# Patient Record
Sex: Female | Born: 1950
Health system: Southern US, Community
[De-identification: ages and names within clinical notes are randomized; demographics above are authoritative.]

## PROBLEM LIST (undated history)

## (undated) DIAGNOSIS — R51 Headache: Secondary | ICD-10-CM

## (undated) DIAGNOSIS — R112 Nausea with vomiting, unspecified: Secondary | ICD-10-CM

## (undated) DIAGNOSIS — T4145XA Adverse effect of unspecified anesthetic, initial encounter: Secondary | ICD-10-CM

## (undated) DIAGNOSIS — Z9889 Other specified postprocedural states: Secondary | ICD-10-CM

## (undated) DIAGNOSIS — M1990 Unspecified osteoarthritis, unspecified site: Secondary | ICD-10-CM

## (undated) DIAGNOSIS — R519 Headache, unspecified: Secondary | ICD-10-CM

## (undated) DIAGNOSIS — M419 Scoliosis, unspecified: Secondary | ICD-10-CM

## (undated) DIAGNOSIS — N189 Chronic kidney disease, unspecified: Secondary | ICD-10-CM

## (undated) DIAGNOSIS — T8859XA Other complications of anesthesia, initial encounter: Secondary | ICD-10-CM

## (undated) DIAGNOSIS — C801 Malignant (primary) neoplasm, unspecified: Secondary | ICD-10-CM

## (undated) HISTORY — PX: TUBAL LIGATION: SHX77

---
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
## 1978-12-05 HISTORY — PX: OTHER SURGICAL HISTORY: SHX169

## 1986-12-05 HISTORY — PX: TUBAL LIGATION: SHX77

## 1987-12-06 HISTORY — PX: OTHER SURGICAL HISTORY: SHX169

## 1997-12-05 HISTORY — PX: BREAST SURGERY: SHX581

## 2001-10-31 ENCOUNTER — Ambulatory Visit (HOSPITAL_COMMUNITY): Admission: RE | Admit: 2001-10-31 | Discharge: 2001-10-31 | Payer: Self-pay | Admitting: Orthopaedic Surgery

## 2001-10-31 ENCOUNTER — Encounter: Payer: Self-pay | Admitting: Orthopaedic Surgery

## 2001-12-05 HISTORY — PX: JOINT REPLACEMENT: SHX530

## 2002-04-24 ENCOUNTER — Ambulatory Visit (HOSPITAL_COMMUNITY): Admission: RE | Admit: 2002-04-24 | Discharge: 2002-04-24 | Payer: Self-pay | Admitting: Orthopedic Surgery

## 2002-04-24 ENCOUNTER — Encounter: Payer: Self-pay | Admitting: Orthopedic Surgery

## 2002-05-22 ENCOUNTER — Encounter: Payer: Self-pay | Admitting: Orthopaedic Surgery

## 2002-05-22 ENCOUNTER — Inpatient Hospital Stay (HOSPITAL_COMMUNITY): Admission: RE | Admit: 2002-05-22 | Discharge: 2002-05-26 | Payer: Self-pay | Admitting: Orthopaedic Surgery

## 2003-05-14 ENCOUNTER — Ambulatory Visit (HOSPITAL_COMMUNITY): Admission: RE | Admit: 2003-05-14 | Discharge: 2003-05-14 | Payer: Self-pay | Admitting: Orthopaedic Surgery

## 2003-05-14 ENCOUNTER — Encounter: Payer: Self-pay | Admitting: Orthopaedic Surgery

## 2004-11-24 ENCOUNTER — Ambulatory Visit (HOSPITAL_COMMUNITY): Admission: RE | Admit: 2004-11-24 | Discharge: 2004-11-24 | Payer: Self-pay | Admitting: Orthopaedic Surgery

## 2006-01-27 ENCOUNTER — Encounter: Admission: RE | Admit: 2006-01-27 | Discharge: 2006-01-27 | Payer: Self-pay | Admitting: Orthopaedic Surgery

## 2006-02-10 ENCOUNTER — Encounter: Admission: RE | Admit: 2006-02-10 | Discharge: 2006-02-10 | Payer: Self-pay | Admitting: Orthopaedic Surgery

## 2006-03-31 ENCOUNTER — Encounter: Admission: RE | Admit: 2006-03-31 | Discharge: 2006-03-31 | Payer: Self-pay | Admitting: Orthopaedic Surgery

## 2006-07-05 ENCOUNTER — Encounter: Admission: RE | Admit: 2006-07-05 | Discharge: 2006-07-05 | Payer: Self-pay | Admitting: Neurosurgery

## 2008-12-05 HISTORY — PX: BACK SURGERY: SHX140

## 2009-09-21 ENCOUNTER — Ambulatory Visit (HOSPITAL_COMMUNITY): Admission: RE | Admit: 2009-09-21 | Discharge: 2009-09-21 | Payer: Self-pay | Admitting: Neurosurgery

## 2011-03-10 LAB — CBC
HCT: 39.1 % (ref 36.0–46.0)
Hemoglobin: 13.6 g/dL (ref 12.0–15.0)

## 2011-04-22 NOTE — Op Note (Signed)
[place]. [facility]  Patient:    MEGHIN, THIVIERGE Visit Number: [id number] MRN: [id number]          Service Type: SUR Location: 5000 5022 01 Attending Physician:  Randolm Idol Dictated by:   Claude Manges. Cleophas Dunker, M.D. Proc. Date: 05/22/02 Admit Date:  05/22/2002                             Operative Report  PREOPERATIVE DIAGNOSIS:  Osteoarthritis, right hip with right hip deformity secondary to [employer].  POSTOPERATIVE DIAGNOSIS:  Osteoarthritis, right hip with right hip deformity secondary to [facility].  OPERATION PERFORMED:  Right total hip replacement.  SURGEON:  Claude Manges. Cleophas Dunker, M.D.  ASSISTANT:  Georgena Spurling, M.D.  COMPLICATIONS:  None.  COMPONENTS:  Depuy AML small stature 10.5 femoral component.  A 32 mm outer diameter femoral head with a +1 mm neck length, 54 mm outer diameter pinnacle acetabular cup with three spikes.  Apex hole eliminator and the Pinnacle Marathon lipped acetabular liner with a 10 degree angle.  DESCRIPTION OF PROCEDURE:  With the patient comfortable on the operating table and under general orotracheal anesthesia, the nursing staff inserted a Foley catheter.  The patient was then placed in a lateral decubitus position with the right side up and maintained with the Innomed hip system.  The right hip was then prepped with Betadine scrub and DuraPrep from the iliac crest to below the knee.  Sterile draping was performed.  A routine Southern incision and via sharp dissection carried down to subcutaneous tissue.  The adipose was incised with a Bovie.  Self-retaining retractors were inserted.  The iliotibial band was identified and incised along the length of the skin incision.  Again self-retaining retractors were inserted more deeply. With the hip internally rotated, the short external rotators were identified and they were tagged with 0 Tycron suture and the tendinous structures were then incised with a Bovie.  The  capsule was identified and incised on the femoral neck and head.  There was a small clear yellow joint effusion.  The head was then dislocated posteriorly without difficulty.  The head was oval and significantly misshapen.  There were large areas of articular cartilage loss.  The calcar guide was used to osteotomize the calcar with the oscillating saw.  The head was then removed.  The soft tissue was then incised about the piriformis fossa.  A starter hole was then made and reaming was performed to 10 mm at which point, I had excellent endosteal purchase.  Calcar reamer confirmed the fact that I was well within the canal.  The smallest rasp was a 10-5 medial modified aspect. This was carefully impacted under the calcar.  Calcar reamer was used to obtain appropriate calcar angle.  The level of the resection was above the lesser trochanter as there was very little if any neck remaining because of the significant deformity.  The acetabular retractors were inserted.  The acetabular labrum was sharply incised, retractors placed carefully around the periphery of the acetabulum.  Reaming was then performed to 53 mm to accept a 54 mm prosthesis.  There was considerable shallowness of the acetabulum which was altered with the reamers so that we had a nice oval rim.  We elected to use the trispike acetabular component for better purchase and then this was impacted using a 54 mm outer diameter.  There was a little bit of a gap superiorly but it  fit very nicely medially, laterally and inferiorly.  We inserted the trial acetabular polyethylene liner and then reapplied the rasp and the +1 neck length with a 32 mm hip ball.  We had excellent stability in flexion and extension.  We had re-established some leg length discrepancy.  She was at least 5/8 inch short preoperatively.  The trial components were removed.  We checked to make sure the acetabulum was impacted flush into the acetabulum.  The apex  hole eliminator was inserted followed by the final Marathon polyethylene liner.  The 10.5 mm femoral component was then impacted onto the calcar and then we again trialed the +1 neck length 32 mm hip ball.  We had excellent stability.  Accordingly, the neck was then cleaned of any fluid and final hip ball was then applied i.e. a 32 mm outer diameter with a +1 mm neck length.  The hip was then reduced and the hip was placed through a full range of motion with excellent stability in both flexion and extension.  The wound was irrigated with saline and antibiotic solution throughout the operative procedure.  We had a nice dry bed and a Hemovac was not necessary. The hip capsule was closed with interrupted #1 Ethibond.  Short external rotators were reapproximated with the same material.  The iliotibial band was closed with running 0 Vicryl.  The subcutaneous closed in several layers with 0 and 2-0 Vicryl.  The skin was closed with skin clips.  The procedure was technically difficult because of the significant deformity. I felt  we had an excellent result.  The patient tolerated the procedure without complications. Dictated by:   Claude Manges. Cleophas Dunker, M.D. Attending Physician:  Randolm Idol DD:  05/22/02 TD:  05/23/02 Job: [id number]

## 2011-04-22 NOTE — H&P (Signed)
[place]. [facility]  Patient:    Leslie, Grant Visit Number: [id number] MRN: [id number]          Service Type: SUR Location: 5000 5022 01 Attending Physician:  Randolm Idol Dictated by:   Arnoldo Morale, P.A. Admit Date:  05/22/2002 Discharge Date: 05/26/2002                           History and Physical  DATE OF BIRTH:  1951-04-04  CHIEF COMPLAINT:  Progressively worsening right hip pain for many years.  HISTORY OF PRESENT ILLNESS:  This 60 year old white female patient presents to Dr. 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 She has had no known injury or any surgery to the hip since that time, but she has had pain in her right hip since she was a teenager.  It has been getting progressively worse over the last several years.  At this time the pain in the right hip is described as an intermittent dull sensation located mainly over the greater trochanter, buttock, and back region with occasional radiation distally into her knee.  It increases when she sits in a certain position or if she bends over or has to walk in her bare feet.  She does have a leg length discrepancy of about 5/8 of an inch.  The pain does decrease when she lies down or flexes at the hip.  She does feel that the hip pops at times and she has had problems with back pain because of her leg length discrepancy and was recently diagnosed with an annular tear on the left side at L4-5.  That was treated with Medrol Dosepak and she is doing better with that.  She is not currently taking any medications really for pain in her hip other than occasional Advil.  She does not walk with any assistive devices.  ALLERGIES:  No known drug allergies.  CURRENT MEDICATIONS: 1. Tamoxifen 20 mg p.o. q.d. 2. Tylenol PM two tablets p.o. q.h.s. p.r.n. insomnia. 3. Tums four tablets p.o. q.h.s.  PAST MEDICAL HISTORY: 1. She was diagnosed with breast  cancer in 1999 and had a left breast    lumpectomy with radiation treatment and she has almost completed her    Tamoxifen. 2. She denies any other medical problems including diabetes, hypertension,    thyroid disease, hiatal hernia, peptic ulcer disease, heart disease,    asthma, reflux, or any other chronic medical condition.  PAST SURGICAL HISTORY: 1. Tonsillectomy in 1959. 2. Excision of a sweat gland tumor in 1980 by Dr. Ivonne Andrew. 3. Bilateral tubal ligation in 1988 by Dr. Herschell Dimes. 4. Removal of infarcted fallopian tube in 1989 by Dr. Herschell Dimes. 5. Lumpectomy, left breast, in 1999 by Dr. Lyda Kalata.  SOCIAL HISTORY:  She has a five pack-year history of cigarette smoking, which she quit 20 years ago.  She drinks alcohol socially about once a month.  She does not use any drugs.  She is married and has two children.  She and her husband live in a two story house with two steps into the main entrance.  Her bedroom in that house is on the second floor, but they may be moving to a one story house with no steps, and they are going to try to do that before her surgery.  She is currently an associate professor of biology at [employer]  Her medical doctor is  Dr. Herschell Dimes, which is her OB-GYN.  His phone number is [phone] and her oncologist is Dr. Fredricka Bonine at [phone].  FAMILY HISTORY:  Her mother is alive at age 54 with hypertension, osteoporosis, and early glaucoma.  Her father is alive at age 37 with a history of colon cancer.  She has two brothers, age 25 and 84, and two sisters, age 8 and 20, and they are all alive and well.  Her two daughters are age 50 and 75 and they are alive and healthy.  REVIEW OF SYSTEMS:  She does have a history of headaches in the past, but they seem to have lessened since she changed jobs.  Also since she has gone through menopause, she seems to get them less and less.  She does complain of occasional neck pain.  She does have scoliosis  due to her hip.  She does have a history of erythema nodosum but has had no problems with that recently.  She does wear glasses.  She does not have a living will and her power of attorney is Geanie Cooley, her husband.  All other systems are negative and noncontributory.  PHYSICAL EXAMINATION:  GENERAL:  A well-developed, well-nourished, thin white female, who walks with a limp on the right.  Mood and affect are appropriate.  She talks easily with the examiner.  VITAL SIGNS:  Height 5 feet 7-1/2 inches, weight 145 pounds, BMI is 22. Temperature 98.6 degrees Fahrenheit, pulse 84, respirations 14, and blood pressure 136/72.  HEENT:  Normocephalic, atraumatic, without frontal or maxillary sinus tenderness to palpation.  Conjunctivae pink, sclerae anicteric.  PERRLA.  EOMs intact.  Funduscopic exam shows good red reflex bilaterally.  No visible external ear deformities.  Hearing grossly intact.  Tympanic membranes pearly gray bilaterally with good light reflex.  Nasal septum midline.  Nasal mucosa pink and moist without exudates or polyps noted.  Buccal mucosa pink and moist.  Good dentition.  Pharynx without erythema or exudates.  Tongue and uvula midline.  Tongue without fasciculations and uvula rises equally with phonation.  NECK:  No visible masses or lesions noted.  Trachea midline.  No palpable lymphadenopathy or thyromegaly.  She has full range of motion and nontender to palpation along the cervical spine.  CARDIOVASCULAR:  Heart rate and rhythm regular.  S1, S2 present without rubs, clicks, or murmurs noted.  RESPIRATORY:  Respirations even and unlabored.  Sounds clear to auscultation bilaterally without rales or wheezes noted.  ABDOMEN:  Rounded abdominal contour.  Bowel sounds present x4 quadrants. Soft, nontender to palpation without hepatosplenomegaly or CVA tenderness. Femoral pulses +2 bilaterally.  She is currently nontender to palpation along the entire length of the  vertebral column.  BREASTS/GENITOURINARY/RECTAL/PELVIC:  These exams deferred at this time.   MUSCULOSKELETAL:  She has no obvious deformities.  Bilateral upper extremities with full range of motion of these extremities without pain.  Radial pulses +2 bilaterally.  She has full range of motion of her knees, ankles, and toes bilaterally.  DP and PT pulses are +2.  She has no pain with palpation about the left hip at all and has full range of motion internal and external rotation without pain.  She has no real pain with palpation over the right groin or right greater trochanter.  She is able to actively get her hip up to 90 degrees and then she can possibly move it up to about 100-110 degrees.  She has full extension, but little internal and external rotation  and these movements do cause pain.  Abduction is probably only about 15 degrees on the right.  NEUROLOGIC:  Alert and oriented x3.  Cranial nerves II-XII are grossly intact. Strength 5/5 bilateral upper and lower extremities.  Rapid alternating movements intact.  Deep tendon reflexes 2+ bilateral upper and lower extremities.  Sensation intact to light touch.  RADIOLOGIC FINDINGS:  X-rays taken in our office have shown a dysplastic hip on the right.  IMPRESSION: 1. 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 2. History of breast cancer, status post lumpectomy, lymph node dissection,    and radiation. 3. Annular tear, L4-5.  PLAN:  [name] will be admitted to [facility] on May 22, 2002, where she will undergo a right total hip arthroplasty by Dr. Claude Manges. Whitfield.  She will undergo all the routine preoperative laboratory tests and studies prior to this procedure.  She can have no blood pressures or sticks on her left arm because of her lymph node dissection.  She has donated three units of autologous blood for this procedure and if we have any medical problems while she is hospitalized, we  will consult one of the hospitalists. Dictated by:   Arnoldo Morale, P.A. Attending Physician:  Randolm Idol DD:  05/10/02 TD:  05/11/02 Job: [id number]

## 2011-04-22 NOTE — Discharge Summary (Signed)
NAME:  [name], [name]                        ACCOUNT NO.:  [id number]   MEDICAL RECORD NO.:  [id number]                   PATIENT TYPE:  NP   LOCATION:  5022                                 FACILITY:  [facility]   PHYSICIAN:  Claude Manges. Cleophas Dunker, M.D.            DATE OF BIRTH:  17-Oct-1951   DATE OF ADMISSION:  05/22/2002  DATE OF DISCHARGE:  05/26/2002                                 DISCHARGE SUMMARY   ADMISSION DIAGNOSES:  1. Severe dysplasia, right hip, secondary to congenitally dislocated hip.  2. Left breast cancer status post lumpectomy, lymph node dissection and     radiation.  3. Annular tear of disk, L4-L5.   DISCHARGE DIAGNOSES:  1. Severely dysplastic right hip secondary to congenital problem, status     post right total hip arthroplasty.  2. Left breast cancer status post lumpectomy, lymph node dissection and     radiation.  3. Annular tear of the disk at L4-L5.  4. Acute blood loss anemia secondary to surgery.  5. Hematoma, right hip.  6. Hypokalemia.  7. Constipation.  8. Hallucination secondary to narcotic medications.   SURGICAL PROCEDURE:  On May 22, 2002, the patient underwent a right total  hip arthroplasty by Dr. Claude Manges. Whitfield assisted by Dr. Dannielle Huh.   COMPLICATIONS:  None.   CONSULTATIONS:  1. Physical therapy consultation on May 23, 2002.  2. Pharmacy consult for Coumadin therapy on May 22, 2002.  3. Rehabilitation consultation, May 23, 2002.  4. Occupational therapy consultation, May 25, 2002.   HISTORY OF PRESENT ILLNESS:  This 60 year old white female patient presented  to Dr. Cleophas Dunker with a history of a congenitally dislocated hip at birth.  She has had no other injury to her right hip.  She has had progressively  worsening right hip pain over the last several years.  At this point the  pain is described as an intermittent, dull sensation over the greater  trochanter, buttock and back with occasional radiation distally into her  knee.  It increases with sitting in certain positions or if she flexes at  the waist.  She has a leg length discrepancy on that side of about five-  eighths of an inch.  Pain decreases when she lies down.  She has been having  difficulty with back pain because of the leg length discrepancy, and because  of the severe dysplasia she is presenting for a right total hip  arthroplasty.   HOSPITAL COURSE:  The patient tolerated her surgical procedure well without  any postoperative complications.  She was transferred to 5000.  On  postoperative day #1 her temperature maximum was 101.6 and vital signs were  stable.  She had some mild nausea which was treated with medications.  Her  hemoglobin was low at 7.8 with a hematocrit of 22.5 and she was subsequently  transfused with two units of autologous blood.  Her potassium was low at 3.3  and that was supplemented.  She did tolerate transfusion well.   On postoperative day #2 her temperature maximum was 103.  Right hip incision  was well approximated with staples.  Hemoglobin had improved to 8.6 with a  hematocrit of 24.7.  Potassium was now 3.8.  Aggressive pulmonary toilet was  started.  She was switched to p.o. pain medications, continued on therapy  and started on Trinsicon.   She continued to make good progress over the next several days.  She did  have some difficulty with narcotic pain medicine and that was adjusted.  She  had problems with weird dreams and hallucinations on Demerol so she was  finally treated with Vicodin.  She had some problems with constipation and  that was treated effectively with a laxative.  On June 22 she was ready for  discharge home.   DISCHARGE DIET:  She can resume her regular, pre-hospitalization diet.   DISCHARGE MEDICATIONS:  She can resume her pre-hospitalization medications  including  1. Tamoxifen 20 mg p.o. q.d.  2. Tylenol PM two tablets p.o. q.h.s. p.r.n. insomnia.  3. Tums four tablets p.o.  q.h.s.   Additional medications include  1. Vioxx 25 mg p.o. q.d.  2. Vicodin 7.5/500 mg 1-2 p.o. q.4h. p.r.n. for pain.  3. Phenergan 25 mg one-half to one tablet p.o. q.4h. p.r.n. for nausea.  4. Coumadin which she is to take as directed by [facility]     pharmacy and the pharmacist here.  5. Iron 325 mg one tablet p.o. b.i.d. for one month.  6. Laxative as needed for constipation.  7. Skelaxin 400 mg 1-2 tablets p.o. q.6h. p.r.n. for muscle spasms, #45, no     refills.   DISCHARGE ACTIVITIES:  She is to be out of bed, partial weightbearing 50% or  less on the right leg with the use of the walker.  She has arranged for home  health physical therapy with per [facility], and R.N. to draw  her pro times in the pharmacy to monitor her Coumadin.   WOUND CARE:  She needs to keep her right hip incision clean and dry, may  shower after no drainage from the wound for two days.  She is to change the  dressing once a day or as needed depending on the drainage.  She is to  notify Dr. Cleophas Dunker of temperature greater than or equal to 101.5, chills,  pain unrelieved by pain medications or foul-smelling drainage from the  wound.   FOLLOWUP APPOINTMENTS:  She needs to follow up with Dr. Cleophas Dunker in  approximately 7 to 10 days and needs to call [phone] for that appointment.   LABORATORY DATA:  X-ray taken of her pelvis and hip on May 22, 2002, showed  the right total hip replacement in good position and alignment and no acute  bony abnormality.   On May 16, 2002, white blood cell count 3.7, hemoglobin 11, hematocrit  32.3, and platelet count 171,000.  On June 19 hemoglobin was 7.8, hematocrit  22.5.  On June 20 white blood cell count was 4.7, hemoglobin 8.6, hematocrit  24.7 and platelet count 98,000.  On June 21 hemoglobin 8.5, hematocrit 24.6,  and platelet count 113,000.   On June 12 PT was 12.9, INR 0.9, and PTT 28.  On June 22 PT was 21.1, INR  2.1.   On  June 19 sodium was 138, potassium 3.3, glucose 117 and calcium 8.  On  June 20 sodium  was 140, potassium 3.8, chloride 110, CO2 27, glucose 110,  BUN 5, creatinine 0.7 and calcium 8.5.  All other laboratory studies were  within normal limits.     Legrand Pitts Duffy, P.A.                      Claude Manges. Cleophas Dunker, M.D.    KED/MEDQ  D:  07/12/2002  T:  07/17/2002  Job:  [id number]   cc:   Fredricka Bonine

## 2013-11-01 ENCOUNTER — Other Ambulatory Visit: Payer: Self-pay | Admitting: Orthopaedic Surgery

## 2013-11-01 DIAGNOSIS — M7130 Other bursal cyst, unspecified site: Secondary | ICD-10-CM

## 2013-11-05 ENCOUNTER — Ambulatory Visit
Admission: RE | Admit: 2013-11-05 | Discharge: 2013-11-05 | Disposition: A | Discharge status: Home / Self Care | Payer: BC Managed Care – PPO | Source: Ambulatory Visit | Attending: Orthopaedic Surgery | Admitting: Orthopaedic Surgery

## 2013-11-05 DIAGNOSIS — M7130 Other bursal cyst, unspecified site: Secondary | ICD-10-CM

## 2013-11-05 MED ORDER — IOHEXOL 180 MG/ML  SOLN
1.0000 mL | Freq: Once | INTRAMUSCULAR | Status: AC | PRN
  Administered 2013-11-05: 1 mL via INTRA_ARTICULAR

## 2013-11-05 MED ORDER — DEXAMETHASONE SODIUM PHOSPHATE 4 MG/ML IJ SOLN
2.0000 mg | Freq: Once | INTRAMUSCULAR | Status: AC
  Administered 2013-11-05: 2 mg via INTRA_ARTICULAR

## 2013-11-08 NOTE — Progress Notes (Signed)
Patient called to report significant pain in lower back, down legs into both knees after synovial cyst drained 11/05/13.  She has resumed Meloxicam.  States pain is interfering with sleep.  She knows Dr. Benard Rink did inject small amount of steroid into area; I reminded her it will take another couple of days for that to (hopefully) kick in.  She knows to call us next week if pain continues

## 2013-11-18 ENCOUNTER — Encounter (HOSPITAL_COMMUNITY): Payer: Self-pay | Admitting: Pharmacy Technician

## 2013-11-18 ENCOUNTER — Other Ambulatory Visit: Payer: Self-pay | Admitting: Neurosurgery

## 2013-11-19 NOTE — Pre-Procedure Instructions (Signed)
RIE MCNEIL  11/19/2013   Your procedure is scheduled on:  Thursday, December 18th.  Report to [facility], Main Entrance or Entrance "A" at [facility].  Call this number if you have problems the morning of surgery: [phone]   Remember:   Do not eat food or drink liquids after midnight.   Take these medicines the morning of surgery with A SIP OF WATER: Gabapentin.  Take pain medication if needed.  Stop taking Aspirin, Coumadin, Plavix, Effient and Herbal medications.  Do not take any NSAIDs ie: Ibuprofen,  Advil,Naproxen or any medication containing Aspirin.  Do not wear jewelry, make-up or nail polish.  Do not wear lotions, powders, or perfumes.   Do not shave 48 hours prior to surgery.   Do not bring valuables to the hospital.  [facility] is not responsible  for any belongings or valuables.               Contacts, dentures or bridgework may not be worn into surgery.  Leave suitcase in the car. After surgery it may be brought to your room.  For patients admitted to the hospital, discharge time is determined by your treatment team.               Patients discharged the day of surgery will not be allowed to drive home.  Name and phone number of your driver: -   Special Instructions: [facility] [address], Main Entrance [facility] "A"   Please read over the following fact sheets that you were given: Pain Booklet, Coughing and Deep Breathing and Surgical Site Infection Prevention

## 2013-11-20 ENCOUNTER — Encounter (HOSPITAL_COMMUNITY)
Admission: RE | Admit: 2013-11-20 | Discharge: 2013-11-20 | Disposition: A | Discharge status: Home / Self Care | Payer: BC Managed Care – PPO | Source: Ambulatory Visit | Attending: Neurosurgery | Admitting: Neurosurgery

## 2013-11-20 ENCOUNTER — Encounter (HOSPITAL_COMMUNITY): Payer: Self-pay

## 2013-11-20 HISTORY — DX: Nausea with vomiting, unspecified: R11.2

## 2013-11-20 HISTORY — DX: Other specified postprocedural states: Z98.890

## 2013-11-20 HISTORY — DX: Malignant (primary) neoplasm, unspecified: C80.1

## 2013-11-20 HISTORY — DX: Scoliosis, unspecified: M41.9

## 2013-11-20 HISTORY — DX: Other complications of anesthesia, initial encounter: T88.59XA

## 2013-11-20 HISTORY — DX: Headache: R51

## 2013-11-20 HISTORY — DX: Headache, unspecified: R51.9

## 2013-11-20 HISTORY — DX: Adverse effect of unspecified anesthetic, initial encounter: T41.45XA

## 2013-11-20 HISTORY — DX: Chronic kidney disease, unspecified: N18.9

## 2013-11-20 HISTORY — DX: Unspecified osteoarthritis, unspecified site: M19.90

## 2013-11-20 LAB — CBC
Hemoglobin: 13.3 g/dL (ref 12.0–15.0)
MCV: 89 fL (ref 78.0–100.0)
Platelets: 183 10*3/uL (ref 150–400)
RBC: 4.26 MIL/uL (ref 3.87–5.11)
RDW: 12.5 % (ref 11.5–15.5)
WBC: 4.3 10*3/uL (ref 4.0–10.5)

## 2013-11-20 LAB — BASIC METABOLIC PANEL
BUN: 14 mg/dL (ref 6–23)
Calcium: 9.2 mg/dL (ref 8.4–10.5)
Chloride: 105 mEq/L (ref 96–112)
Creatinine, Ser: 0.52 mg/dL (ref 0.50–1.10)
GFR calc Af Amer: >90 mL/min (ref >90)

## 2013-11-20 MED ORDER — CEFAZOLIN SODIUM-DEXTROSE 2-3 GM-% IV SOLR
2.0000 g | INTRAVENOUS | Status: AC
  Administered 2013-11-21: 2 g via INTRAVENOUS
  Filled 2013-11-20: qty 50

## 2013-11-21 ENCOUNTER — Ambulatory Visit (HOSPITAL_COMMUNITY): Payer: BC Managed Care – PPO | Admitting: Anesthesiology

## 2013-11-21 ENCOUNTER — Encounter (HOSPITAL_COMMUNITY): Payer: Self-pay | Admitting: *Deleted

## 2013-11-21 ENCOUNTER — Ambulatory Visit (HOSPITAL_COMMUNITY)
Admission: RE | Admit: 2013-11-21 | Discharge: 2013-11-22 | Disposition: A | Discharge status: Home / Self Care | Payer: BC Managed Care – PPO | Source: Ambulatory Visit | Attending: Neurosurgery | Admitting: Neurosurgery

## 2013-11-21 ENCOUNTER — Encounter (HOSPITAL_COMMUNITY)
Admission: RE | Disposition: A | Discharge status: Home / Self Care | Payer: Self-pay | Source: Ambulatory Visit | Attending: Neurosurgery

## 2013-11-21 ENCOUNTER — Encounter (HOSPITAL_COMMUNITY): Payer: BC Managed Care – PPO | Admitting: Anesthesiology

## 2013-11-21 ENCOUNTER — Ambulatory Visit (HOSPITAL_COMMUNITY): Payer: BC Managed Care – PPO

## 2013-11-21 DIAGNOSIS — Z87891 Personal history of nicotine dependence: Secondary | ICD-10-CM | POA: Insufficient documentation

## 2013-11-21 DIAGNOSIS — Z01812 Encounter for preprocedural laboratory examination: Secondary | ICD-10-CM | POA: Insufficient documentation

## 2013-11-21 DIAGNOSIS — M7130 Other bursal cyst, unspecified site: Secondary | ICD-10-CM | POA: Insufficient documentation

## 2013-11-21 DIAGNOSIS — M4120 Other idiopathic scoliosis, site unspecified: Secondary | ICD-10-CM | POA: Insufficient documentation

## 2013-11-21 DIAGNOSIS — M7138 Other bursal cyst, other site: Secondary | ICD-10-CM | POA: Diagnosis present

## 2013-11-21 DIAGNOSIS — Z853 Personal history of malignant neoplasm of breast: Secondary | ICD-10-CM | POA: Insufficient documentation

## 2013-11-21 DIAGNOSIS — M48062 Spinal stenosis, lumbar region with neurogenic claudication: Secondary | ICD-10-CM | POA: Insufficient documentation

## 2013-11-21 HISTORY — PX: LUMBAR LAMINECTOMY/DECOMPRESSION MICRODISCECTOMY: SHX5026

## 2013-11-21 SURGERY — LUMBAR LAMINECTOMY/DECOMPRESSION MICRODISCECTOMY 1 LEVEL
Anesthesia: General | Site: Back | Laterality: Bilateral

## 2013-11-21 MED ORDER — DIAZEPAM 5 MG PO TABS
5.0000 mg | ORAL_TABLET | Freq: Four times a day (QID) | ORAL | Status: DC | PRN
  Administered 2013-11-21 – 2013-11-22 (×2): 5 mg via ORAL
  Filled 2013-11-21: qty 1

## 2013-11-21 MED ORDER — OXYCODONE-ACETAMINOPHEN 5-325 MG PO TABS
1.0000 | ORAL_TABLET | ORAL | Status: DC | PRN
  Administered 2013-11-21 – 2013-11-22 (×2): 2 via ORAL
  Filled 2013-11-21 (×2): qty 2

## 2013-11-21 MED ORDER — ARTIFICIAL TEARS OP OINT
TOPICAL_OINTMENT | OPHTHALMIC | Status: DC | PRN
  Administered 2013-11-21: 1 via OPHTHALMIC

## 2013-11-21 MED ORDER — BUPIVACAINE-EPINEPHRINE PF 0.5-1:200000 % IJ SOLN
INTRAMUSCULAR | Status: DC | PRN
  Administered 2013-11-21: 10 mL

## 2013-11-21 MED ORDER — GLYCOPYRROLATE 0.2 MG/ML IJ SOLN
INTRAMUSCULAR | Status: DC | PRN
  Administered 2013-11-21 (×2): 0.4 mg via INTRAVENOUS

## 2013-11-21 MED ORDER — ACETAMINOPHEN 325 MG PO TABS: 650.0000 mg | ORAL_TABLET | ORAL | Status: DC | PRN

## 2013-11-21 MED ORDER — MENTHOL 3 MG MT LOZG: 1.0000 | LOZENGE | OROMUCOSAL | Status: DC | PRN

## 2013-11-21 MED ORDER — DOCUSATE SODIUM 100 MG PO CAPS
100.0000 mg | ORAL_CAPSULE | Freq: Two times a day (BID) | ORAL | Status: DC
  Administered 2013-11-21 – 2013-11-22 (×2): 100 mg via ORAL
  Filled 2013-11-21 (×3): qty 1

## 2013-11-21 MED ORDER — 0.9 % SODIUM CHLORIDE (POUR BTL) OPTIME
TOPICAL | Status: DC | PRN
  Administered 2013-11-21: 1000 mL

## 2013-11-21 MED ORDER — ONDANSETRON HCL 4 MG/2ML IJ SOLN: 4.0000 mg | Freq: Four times a day (QID) | INTRAMUSCULAR | Status: DC | PRN

## 2013-11-21 MED ORDER — PHENYLEPHRINE HCL 10 MG/ML IJ SOLN
10.0000 mg | INTRAVENOUS | Status: DC | PRN
  Administered 2013-11-21: 10 ug/min via INTRAVENOUS

## 2013-11-21 MED ORDER — GABAPENTIN 300 MG PO CAPS
300.0000 mg | ORAL_CAPSULE | Freq: Three times a day (TID) | ORAL | Status: DC
  Administered 2013-11-21 – 2013-11-22 (×2): 300 mg via ORAL
  Filled 2013-11-21 (×4): qty 1

## 2013-11-21 MED ORDER — HYDROMORPHONE HCL PF 1 MG/ML IJ SOLN
INTRAMUSCULAR | Status: AC
  Filled 2013-11-21: qty 1

## 2013-11-21 MED ORDER — HEMOSTATIC AGENTS (NO CHARGE) OPTIME
TOPICAL | Status: DC | PRN
  Administered 2013-11-21: 1 via TOPICAL

## 2013-11-21 MED ORDER — MORPHINE SULFATE 2 MG/ML IJ SOLN
1.0000 mg | INTRAMUSCULAR | Status: DC | PRN
  Administered 2013-11-21: 2 mg via INTRAVENOUS
  Filled 2013-11-21: qty 1

## 2013-11-21 MED ORDER — ALUM & MAG HYDROXIDE-SIMETH 200-200-20 MG/5ML PO SUSP: 30.0000 mL | Freq: Four times a day (QID) | ORAL | Status: DC | PRN

## 2013-11-21 MED ORDER — ACETAMINOPHEN 650 MG RE SUPP: 650.0000 mg | RECTAL | Status: DC | PRN

## 2013-11-21 MED ORDER — HYDROCODONE-ACETAMINOPHEN 5-325 MG PO TABS: 1.0000 | ORAL_TABLET | ORAL | Status: DC | PRN

## 2013-11-21 MED ORDER — BACITRACIN ZINC 500 UNIT/GM EX OINT
TOPICAL_OINTMENT | CUTANEOUS | Status: DC | PRN
  Administered 2013-11-21: 1 via TOPICAL

## 2013-11-21 MED ORDER — ONDANSETRON HCL 4 MG/2ML IJ SOLN
INTRAMUSCULAR | Status: DC | PRN
  Administered 2013-11-21: 4 mg via INTRAVENOUS

## 2013-11-21 MED ORDER — FENTANYL CITRATE 0.05 MG/ML IJ SOLN
INTRAMUSCULAR | Status: DC | PRN
Start: 2013-11-21 — End: 2013-11-21
  Administered 2013-11-21 (×3): 50 ug via INTRAVENOUS
  Administered 2013-11-21: 100 ug via INTRAVENOUS

## 2013-11-21 MED ORDER — THROMBIN 5000 UNITS EX SOLR
CUTANEOUS | Status: DC | PRN
  Administered 2013-11-21 (×2): 5000 [IU] via TOPICAL

## 2013-11-21 MED ORDER — LIDOCAINE HCL (CARDIAC) 20 MG/ML IV SOLN
INTRAVENOUS | Status: DC | PRN
  Administered 2013-11-21: 80 mg via INTRAVENOUS

## 2013-11-21 MED ORDER — SODIUM CHLORIDE 0.9 % IR SOLN
Status: DC | PRN
  Administered 2013-11-21: 18:00:00

## 2013-11-21 MED ORDER — MIDAZOLAM HCL 5 MG/5ML IJ SOLN
INTRAMUSCULAR | Status: DC | PRN
  Administered 2013-11-21: 2 mg via INTRAVENOUS

## 2013-11-21 MED ORDER — ONDANSETRON HCL 4 MG/2ML IJ SOLN: 4.0000 mg | INTRAMUSCULAR | Status: DC | PRN

## 2013-11-21 MED ORDER — DIAZEPAM 5 MG PO TABS
ORAL_TABLET | ORAL | Status: AC
  Filled 2013-11-21: qty 1

## 2013-11-21 MED ORDER — LACTATED RINGERS IV SOLN: INTRAVENOUS | Status: DC

## 2013-11-21 MED ORDER — OXYCODONE HCL 5 MG PO TABS: 5.0000 mg | ORAL_TABLET | Freq: Once | ORAL | Status: DC | PRN

## 2013-11-21 MED ORDER — HYDROMORPHONE HCL PF 1 MG/ML IJ SOLN
0.2500 mg | INTRAMUSCULAR | Status: DC | PRN
  Administered 2013-11-21 (×2): 0.5 mg via INTRAVENOUS

## 2013-11-21 MED ORDER — LACTATED RINGERS IV SOLN
INTRAVENOUS | Status: DC
  Administered 2013-11-21: 12:00:00 via INTRAVENOUS

## 2013-11-21 MED ORDER — LACTATED RINGERS IV SOLN
INTRAVENOUS | Status: DC | PRN
  Administered 2013-11-21 (×2): via INTRAVENOUS

## 2013-11-21 MED ORDER — OXYCODONE HCL 5 MG/5ML PO SOLN: 5.0000 mg | Freq: Once | ORAL | Status: DC | PRN

## 2013-11-21 MED ORDER — CEFAZOLIN SODIUM-DEXTROSE 2-3 GM-% IV SOLR
2.0000 g | Freq: Three times a day (TID) | INTRAVENOUS | Status: AC
  Administered 2013-11-22 (×2): 2 g via INTRAVENOUS
  Filled 2013-11-21 (×2): qty 50

## 2013-11-21 MED ORDER — ROCURONIUM BROMIDE 100 MG/10ML IV SOLN
INTRAVENOUS | Status: DC | PRN
  Administered 2013-11-21: 50 mg via INTRAVENOUS

## 2013-11-21 MED ORDER — NEOSTIGMINE METHYLSULFATE 1 MG/ML IJ SOLN
INTRAMUSCULAR | Status: DC | PRN
  Administered 2013-11-21: 3 mg via INTRAVENOUS

## 2013-11-21 MED ORDER — PHENYLEPHRINE HCL 10 MG/ML IJ SOLN
INTRAMUSCULAR | Status: DC | PRN
  Administered 2013-11-21 (×2): 40 ug via INTRAVENOUS
  Administered 2013-11-21 (×2): 80 ug via INTRAVENOUS

## 2013-11-21 MED ORDER — PROPOFOL 10 MG/ML IV BOLUS
INTRAVENOUS | Status: DC | PRN
  Administered 2013-11-21: 200 mg via INTRAVENOUS

## 2013-11-21 MED ORDER — PHENOL 1.4 % MT LIQD: 1.0000 | OROMUCOSAL | Status: DC | PRN

## 2013-11-21 SURGICAL SUPPLY — 56 items
APL SKNCLS STERI-STRIP NONHPOA (GAUZE/BANDAGES/DRESSINGS) ×1
BAG DECANTER FOR FLEXI CONT (MISCELLANEOUS) ×2 IMPLANT
BENZOIN TINCTURE PRP APPL 2/3 (GAUZE/BANDAGES/DRESSINGS) ×2 IMPLANT
BLADE SURG ROTATE 9660 (MISCELLANEOUS) IMPLANT
BRUSH SCRUB EZ PLAIN DRY (MISCELLANEOUS) ×2 IMPLANT
BUR ACORN 6.0 (BURR) ×2 IMPLANT
BUR MATCHSTICK NEURO 3.0 LAGG (BURR) ×2 IMPLANT
CANISTER SUCT 3000ML (MISCELLANEOUS) ×2 IMPLANT
CONT SPEC 4OZ CLIKSEAL STRL BL (MISCELLANEOUS) ×3 IMPLANT
DRAPE LAPAROTOMY 100X72X124 (DRAPES) ×2 IMPLANT
DRAPE MICROSCOPE LEICA (MISCELLANEOUS) ×2 IMPLANT
DRAPE POUCH INSTRU U-SHP 10X18 (DRAPES) ×2 IMPLANT
DRAPE SURG 17X23 STRL (DRAPES) ×8 IMPLANT
ELECT BLADE 4.0 EZ CLEAN MEGAD (MISCELLANEOUS) ×2
ELECT REM PT RETURN 9FT ADLT (ELECTROSURGICAL) ×2
ELECTRODE BLDE 4.0 EZ CLN MEGD (MISCELLANEOUS) ×1 IMPLANT
ELECTRODE REM PT RTRN 9FT ADLT (ELECTROSURGICAL) ×1 IMPLANT
GAUZE SPONGE 4X4 16PLY XRAY LF (GAUZE/BANDAGES/DRESSINGS) IMPLANT
GLOVE BIO SURGEON STRL SZ 6.5 (GLOVE) ×2 IMPLANT
GLOVE BIO SURGEON STRL SZ8 (GLOVE) ×1 IMPLANT
GLOVE BIO SURGEON STRL SZ8.5 (GLOVE) ×2 IMPLANT
GLOVE BIOGEL PI IND STRL 6.5 (GLOVE) IMPLANT
GLOVE BIOGEL PI IND STRL 7.5 (GLOVE) IMPLANT
GLOVE BIOGEL PI INDICATOR 6.5 (GLOVE) ×1
GLOVE BIOGEL PI INDICATOR 7.5 (GLOVE) ×1
GLOVE EXAM NITRILE LRG STRL (GLOVE) IMPLANT
GLOVE EXAM NITRILE MD LF STRL (GLOVE) IMPLANT
GLOVE EXAM NITRILE XL STR (GLOVE) IMPLANT
GLOVE EXAM NITRILE XS STR PU (GLOVE) IMPLANT
GLOVE SS BIOGEL STRL SZ 8 (GLOVE) ×1 IMPLANT
GLOVE SUPERSENSE BIOGEL SZ 8 (GLOVE) ×2
GOWN BRE IMP SLV AUR LG STRL (GOWN DISPOSABLE) IMPLANT
GOWN BRE IMP SLV AUR XL STRL (GOWN DISPOSABLE) ×3 IMPLANT
GOWN STRL REIN 2XL LVL4 (GOWN DISPOSABLE) IMPLANT
KIT BASIN OR (CUSTOM PROCEDURE TRAY) ×2 IMPLANT
KIT ROOM TURNOVER OR (KITS) ×2 IMPLANT
NDL HYPO 21X1.5 SAFETY (NEEDLE) IMPLANT
NEEDLE HYPO 21X1.5 SAFETY (NEEDLE) IMPLANT
NEEDLE HYPO 22GX1.5 SAFETY (NEEDLE) ×2 IMPLANT
NS IRRIG 1000ML POUR BTL (IV SOLUTION) ×2 IMPLANT
PACK LAMINECTOMY NEURO (CUSTOM PROCEDURE TRAY) ×2 IMPLANT
PAD ARMBOARD 7.5X6 YLW CONV (MISCELLANEOUS) ×6 IMPLANT
PATTIES SURGICAL .5 X1 (DISPOSABLE) IMPLANT
RUBBERBAND STERILE (MISCELLANEOUS) ×4 IMPLANT
SPONGE GAUZE 4X4 12PLY (GAUZE/BANDAGES/DRESSINGS) ×2 IMPLANT
SPONGE SURGIFOAM ABS GEL SZ50 (HEMOSTASIS) ×2 IMPLANT
STRIP CLOSURE SKIN 1/2X4 (GAUZE/BANDAGES/DRESSINGS) ×2 IMPLANT
SUT VIC AB 1 CT1 18XBRD ANBCTR (SUTURE) ×1 IMPLANT
SUT VIC AB 1 CT1 8-18 (SUTURE) ×2
SUT VIC AB 2-0 CP2 18 (SUTURE) ×2 IMPLANT
SYR 20CC LL (SYRINGE) IMPLANT
SYR 20ML ECCENTRIC (SYRINGE) ×2 IMPLANT
TAPE CLOTH SURG 4X10 WHT LF (GAUZE/BANDAGES/DRESSINGS) ×1 IMPLANT
TOWEL OR 17X24 6PK STRL BLUE (TOWEL DISPOSABLE) ×2 IMPLANT
TOWEL OR 17X26 10 PK STRL BLUE (TOWEL DISPOSABLE) ×2 IMPLANT
WATER STERILE IRR 1000ML POUR (IV SOLUTION) ×2 IMPLANT

## 2013-11-21 NOTE — Anesthesia Postprocedure Evaluation (Signed)
  Anesthesia Post-op Note  Patient: [name]  Procedure(s) Performed: Procedure(s) with comments: Bilateral Lumbar three-four laminectomy for synovial cyst (Bilateral) - Bilateral Lumbar three-four laminectomy for synovial cyst  Patient Location: PACU  Anesthesia Type:General  Level of Consciousness: awake, alert , sedated and patient cooperative  Airway and Oxygen Therapy: Patient Spontanous Breathing  Post-op Pain: mild  Post-op Assessment: Post-op Vital signs reviewed, Patient's Cardiovascular Status Stable, Respiratory Function Stable, Patent Airway, No signs of Nausea or vomiting, Adequate PO intake and Pain level controlled  Post-op Vital Signs: stable  Complications: No apparent anesthesia complications

## 2013-11-21 NOTE — Anesthesia Procedure Notes (Signed)
Procedure Name: Intubation Date/Time: 11/21/2013 5:04 PM Performed by: Angelica Pou Pre-anesthesia Checklist: Patient identified, Patient being monitored, Emergency Drugs available, Timeout performed and Suction available Patient Re-evaluated:Patient Re-evaluated prior to inductionOxygen Delivery Method: Circle system utilized Preoxygenation: Pre-oxygenation with 100% oxygen Intubation Type: IV induction Ventilation: Mask ventilation without difficulty Laryngoscope Size: Mac and 3 Grade View: Grade I Tube type: Oral Tube size: 7.0 mm Number of attempts: 1 Airway Equipment and Method: Stylet Placement Confirmation: ETT inserted through vocal cords under direct vision,  breath sounds checked- equal and bilateral and positive ETCO2 Secured at: 21 cm Tube secured with: Tape Dental Injury: Teeth and Oropharynx as per pre-operative assessment  Comments: Smooth IV induction by Dr Ivin Booty; easy atraumatic intubation by JSmith CRNA.

## 2013-11-21 NOTE — Op Note (Signed)
Brief history: The patient is a 62 year old white female who I performed a left L3-4 discectomy on several years ago. She has done well to until recently when she developed severe back and right leg pain. She has failed medical management and was worked up with a lumbar MRI which demonstrated a large cyst at L3-4 on the right. I discussed the various treatment options with the patient including surgery. She has weighed the risks, benefits, and alternatives surgery and decided proceed with a laminectomy for removal of the cyst.  Preoperative diagnosis: Right L3-4 synovial cyst, lumbago, lumbar radiculopathy, lumbar spinal stenosis, neurogenic claudication  Postoperative diagnosis: Right L3-4 hemorrhagic synovial cyst, lumbago, lumbar radiculopathy, lumbar spinal stenosis, neurogenic claudication  Procedure: L3 laminectomy with gross total resection of right L3-4 hemorrhagic synovial cyst using microdissection  Surgeon: Dr. Delma Officer  Asst.: Dr. Marikay Alar  Anesthesia: Gen. endotracheal  Estimated blood loss: 50 cc  Drains: None  Complications: None  Description of procedure: The patient was brought to the operating room by the anesthesia team. General endotracheal anesthesia was induced. The patient was turned to the prone position on the Wilson frame. The patient's lumbosacral region was then prepared with Betadine scrub and Betadine solution. Sterile drapes were applied.  I then injected the area to be incised with Marcaine with epinephrine solution. I then used a scalpel to make a linear midline incision over the L3-4 intervertebral disc space, incising through the previous surgical scar. I then used electrocautery to perform a bilateral  subperiosteal dissection exposing the spinous process and lamina of L3 and L4. We obtained intraoperative radiograph to confirm our location. I then inserted the [facility] retractor for exposure. I then incised the interspinous ligament at L3-4 and  L2-3. I used Leksell nodule were to remove the spinous process of L3.  We then brought the operative microscope into the field. Under its magnification and illumination we completed the microdissection. We immediately noted that the right L3-4 is said was discolored. I used a high-speed drill to perform a laminotomy at L3 on the right. I then used a Kerrison punches to complete the L3 laminectomy . We encountered a large cystic mass in the epidural space. This appeared to be a hemorrhagic synovial cyst.. We then used microdissection to free up the thecal sac and the right L3 and right L4 nerve root from the cyst. The cyst was adherent to the dura but we able to get a good plane before between the cyst and dura. I then used a Kerrison punch to remove the cyst perform a foraminotomy at about the right L3 and L4 nerve root. We got a gross total resection of the abnormal tissue. We sent off specimens to the pathologist for permanent section but as above I think this was a hemorrhagic synovial cyst.  I then palpated along the ventral surface of the thecal sac and along exit route of the right L3 and right L4 nerve root and noted that the neural structures were well decompressed. This completed the decompression.  We then obtained hemostasis using bipolar electrocautery. We irrigated the wound out with bacitracin solution. We then removed the retractor. We then reapproximated the patient's thoracolumbar fascia with interrupted #1 Vicryl suture. We then reapproximated the patient's subcutaneous tissue with interrupted 3-0 Vicryl suture. We then reapproximated patient's skin with Steri-Strips and benzoin. The was then coated with bacitracin ointment. The drapes were removed. The patient was subsequently returned to the supine position where they were extubated by the anesthesia team.  The patient was then transported to the postanesthesia care unit in stable condition. All sponge instrument and needle counts were  reportedly correct at the end of this case.

## 2013-11-21 NOTE — H&P (Signed)
Subjective: The patient is a 62 year old white female who I performed a L3-4 discectomy on years ago. She has done well until recently when she developed recurrent back and leg pain. She has failed medical management and was worked up with a lumbar MRI. This demonstrated a large L3-4 cystic structure, presumably a synovial cyst. I discussed the various treatment options with the patient and her husband including surgery. She has weighed the risks, benefits, and alternatives surgery and decided proceed with at L3-4 laminectomy for resection of the cyst.   Past Medical History  Diagnosis Date  . Complication of anesthesia   . PONV (postoperative nausea and vomiting)   . Chronic kidney disease     cyst right kidney  . Headache(784.0)     migraines  . Head ache     transient global amnesia after migraines  . Cancer     left breast  . Arthritis   . Scoliosis     Past Surgical History  Procedure Laterality Date  . Tonsillectomy  1959  . Sweat gland Right 1980    excision tumor from sweat gland  . Tubal ligation Bilateral 1988    Dr Mikey Bussing  . Tubal ligation    . Infart fallopian tube  1989     Dr Mikey Bussing  . Breast surgery Left 1999    Dr Corky Downs  . Joint replacement Right 2003     Dr. Cleophas Dunker  . Back surgery  2010    microdectomy Dr. Lovell Sheehan    No Known Allergies  History  Substance Use Topics  . Smoking status: Former Smoker -- 0.50 packs/day    Types: Cigarettes    Quit date: 11/21/1983  . Smokeless tobacco: Never Used  . Alcohol Use: Yes     Comment: rarely    History reviewed. No pertinent family history. Prior to Admission medications   Medication Sig Start Date End Date Taking? Authorizing Provider  gabapentin (NEURONTIN) 300 MG capsule Take 300 mg by mouth 3 (three) times daily.   Yes Historical Provider, MD  HYDROcodone-acetaminophen (NORCO/VICODIN) 5-325 MG per tablet Take 1 tablet by mouth every 6 (six) hours as needed (for pain).   Yes Historical Provider, MD   oxyCODONE-acetaminophen (PERCOCET/ROXICET) 5-325 MG per tablet Take 1 tablet by mouth at bedtime.   Yes Historical Provider, MD  STUDY MEDICATION Place 1 drop into both eyes every morning. Study for Glaucoma thru [facility]. Placebo or 0.02% or timolol maleate   Yes Historical Provider, MD  STUDY MEDICATION Place 1 drop into both eyes at bedtime. Study for Glaucoma thru [facility]. Placebo or 0.02% or timolol maleate   Yes Historical Provider, MD     Review of Systems  Positive ROS: As above  All other systems have been reviewed and were otherwise negative with the exception of those mentioned in the HPI and as above.  Objective: Vital signs in last 24 hours: Temp:  [98.7 F (37.1 C)] 98.7 F (37.1 C) (12/18 1208) Pulse Rate:  [67] 67 (12/18 1208) Resp:  [18] 18 (12/18 1208) BP: (145)/(71) 145/71 mmHg (12/18 1208) SpO2:  [100 %] 100 % (12/18 1208)  General Appearance: Alert, cooperative, no distress, appears stated age Head: Normocephalic, without obvious abnormality, atraumatic Eyes: PERRL, conjunctiva/corneas clear, EOM's intact, fundi benign, both eyes      Ears: Normal TM's and external ear canals, both ears Throat: Lips, mucosa, and tongue normal; teeth and gums normal Neck: Supple, symmetrical, trachea midline, no adenopathy; thyroid: No enlargement/tenderness/nodules; no  carotid bruit or JVD Back: Symmetric, no curvature, ROM normal, no CVA tenderness. The patient's lumbar incision is well-healed. Lungs: Clear to auscultation bilaterally, respirations unlabored Heart: Regular rate and rhythm, S1 and S2 normal, no murmur, rub or gallop Abdomen: Soft, non-tender, bowel sounds active all four quadrants, no masses, no organomegaly Extremities: Extremities normal, atraumatic, no cyanosis or edema Pulses: 2+ and symmetric all extremities Skin: Skin color, texture, turgor normal, no rashes or lesions  NEUROLOGIC:   Mental status: alert and oriented, no  aphasia, good attention span, Fund of knowledge/ memory ok Motor Exam - grossly normal Sensory Exam - grossly normal Reflexes:  Coordination - grossly normal Gait - grossly normal Balance - grossly normal Cranial Nerves: I: smell Not tested  II: visual acuity  OS: Normal    OD: Normal   II: visual fields Full to confrontation  II: pupils Equal, round, reactive to light  III,VII: ptosis None  III,IV,VI: extraocular muscles  Full ROM  V: mastication Normal  V: facial light touch sensation  Normal  V,VII: corneal reflex  Present  VII: facial muscle function - upper  Normal  VII: facial muscle function - lower Normal  VIII: hearing Not tested  IX: soft palate elevation  Normal  IX,X: gag reflex Present  XI: trapezius strength  5/5  XI: sternocleidomastoid strength 5/5  XI: neck flexion strength  5/5  XII: tongue strength  Normal    Data Review Lab Results  Component Value Date   WBC 4.3 11/20/2013   HGB 13.3 11/20/2013   HCT 37.9 11/20/2013   MCV 89.0 11/20/2013   PLT 183 11/20/2013   Lab Results  Component Value Date   NA 139 11/20/2013   K 4.3 11/20/2013   CL 105 11/20/2013   CO2 25 11/20/2013   BUN 14 11/20/2013   CREATININE 0.52 11/20/2013   GLUCOSE 88 11/20/2013   No results found for this basename: INR, PROTIME    Assessment/Plan: L3-4 cyst, spinal stenosis, lumbago, lumbar radiculopathy, neurogenic claudication: I discussed the situation with the patient and her husband. I reviewed the imaging studies with them and pointed out the abnormalities. We have discussed the various treatment options including surgery. I have described the surgical treatment option of an L3-4 laminectomy for resection of the cyst. I've shown him surgical models. We have discussed the risks, benefits, alternatives, and likelihood of achieving our goals with surgery. I've answered all the patient's questions. She has decided proceed with the operation.   Bhargav Barbaro D 11/21/2013  4:24 PM

## 2013-11-21 NOTE — Anesthesia Preprocedure Evaluation (Addendum)
Anesthesia Evaluation  Patient identified by MRN, date of birth, ID band Patient awake    Reviewed: Allergy & Precautions, H&P , NPO status , Patient's Chart, lab work & pertinent test results  History of Anesthesia Complications (+) PONV and history of anesthetic complications  Airway Mallampati: II  Neck ROM: full    Dental   Pulmonary former smoker,          Cardiovascular negative cardio ROS      Neuro/Psych  Headaches,    GI/Hepatic   Endo/Other    Renal/GU      Musculoskeletal  (+) Arthritis -,   Abdominal   Peds  Hematology   Anesthesia Other Findings   Reproductive/Obstetrics                          Anesthesia Physical Anesthesia Plan  ASA: II  Anesthesia Plan: General   Post-op Pain Management:    Induction: Intravenous  Airway Management Planned: Oral ETT  Additional Equipment:   Intra-op Plan:   Post-operative Plan: Extubation in OR  Informed Consent: I have reviewed the patients History and Physical, chart, labs and discussed the procedure including the risks, benefits and alternatives for the proposed anesthesia with the patient or authorized representative who has indicated his/her understanding and acceptance.     Plan Discussed with: CRNA, Anesthesiologist and Surgeon  Anesthesia Plan Comments:         Anesthesia Quick Evaluation

## 2013-11-21 NOTE — Progress Notes (Signed)
Patient ID: [name], female   DOB: [birth year], 62 y.o.   MRN: [id number] Subjective:  The patient is somnolent but easily arousable. She looks well. She is in no apparent distress.  Objective: Vital signs in last 24 hours: Temp:  [97.6 F (36.4 C)-98.7 F (37.1 C)] 97.6 F (36.4 C) (12/18 1850) Pulse Rate:  [67] 67 (12/18 1208) Resp:  [18] 18 (12/18 1208) BP: (145)/(71) 145/71 mmHg (12/18 1208) SpO2:  [100 %] 100 % (12/18 1208)  Intake/Output from previous day:   Intake/Output this shift: Total I/O In: 1500 [I.V.:1500] Out: 100 [Blood:100]  Physical exam patient is somnolent but arousable. She is moving her lower extremities well.  Lab Results:  Recent Labs  11/20/13 0959  WBC 4.3  HGB 13.3  HCT 37.9  PLT 183   BMET  Recent Labs  11/20/13 0959  NA 139  K 4.3  CL 105  CO2 25  GLUCOSE 88  BUN 14  CREATININE 0.52  CALCIUM 9.2    Studies/Results: No results found.  Assessment/Plan: The patient is doing well.  LOS: 0 days     [name] D 11/21/2013, 6:53 PM

## 2013-11-21 NOTE — Preoperative (Signed)
Beta Blockers   Reason not to administer Beta Blockers:Not Applicable 

## 2013-11-21 NOTE — Transfer of Care (Signed)
Immediate Anesthesia Transfer of Care Note  Patient: [name]  Procedure(s) Performed: Procedure(s) with comments: Bilateral Lumbar three-four laminectomy for synovial cyst (Bilateral) - Bilateral Lumbar three-four laminectomy for synovial cyst  Patient Location: PACU  Anesthesia Type:General  Level of Consciousness: awake, alert , oriented and patient cooperative  Airway & Oxygen Therapy: Patient Spontanous Breathing and Patient connected to nasal cannula oxygen  Post-op Assessment: Report given to PACU RN, Post -op Vital signs reviewed and stable and Patient moving all extremities X 4  Post vital signs: Reviewed and stable  Complications: No apparent anesthesia complications

## 2013-11-22 MED ORDER — DIAZEPAM 5 MG PO TABS: 5.0000 mg | ORAL_TABLET | Freq: Four times a day (QID) | ORAL | Status: DC | PRN

## 2013-11-22 MED ORDER — DSS 100 MG PO CAPS: 100.0000 mg | ORAL_CAPSULE | Freq: Two times a day (BID) | ORAL | Status: DC

## 2013-11-22 MED ORDER — OXYCODONE-ACETAMINOPHEN 10-325 MG PO TABS: 1.0000 | ORAL_TABLET | ORAL | Status: AC | PRN

## 2013-11-22 NOTE — Discharge Summary (Signed)
  Physician Discharge Summary  Patient ID: [name] MRN: [id number] DOB/AGE: May 07, 1951 62 y.o.  Admit date: 11/21/2013 Discharge date: 11/22/2013  Admission Diagnoses: Right L3-4 synovial cyst, lumbago, lumbar radiculopathy, neurogenic claudication  Discharge Diagnoses: The same Active Problems:   Synovial cyst of lumbar facet joint   Discharged Condition: good  Hospital Course: I performed at L3 laminectomy for gross total resection of a hemorrhagic right L3-4 synovial cyst on the patient on 11/21/2013. The surgery went well.  The patient's postoperative course was unremarkable. She will likely be discharged home later today. I gave her her discharge instructions and answered all her questions.  Consults: None Significant Diagnostic Studies: None Treatments: L3 laminectomy for resection of synovial cyst using microdissection Discharge Exam: Blood pressure 95/58, pulse 77, temperature 99.3 F (37.4 C), temperature source Oral, resp. rate 16, SpO2 96.00%. The patient is alert and pleasant. Her strength is normal in her lower extremities. Her dressing is clean and dry.  Disposition: Home     Medication List    STOP taking these medications       HYDROcodone-acetaminophen 5-325 MG per tablet  Commonly known as:  NORCO/VICODIN     oxyCODONE-acetaminophen 5-325 MG per tablet  Commonly known as:  PERCOCET/ROXICET  Replaced by:  oxyCODONE-acetaminophen 10-325 MG per tablet      TAKE these medications       diazepam 5 MG tablet  Commonly known as:  VALIUM  Take 1 tablet (5 mg total) by mouth every 6 (six) hours as needed for muscle spasms.     DSS 100 MG Caps  Take 100 mg by mouth 2 (two) times daily.     gabapentin 300 MG capsule  Commonly known as:  NEURONTIN  Take 300 mg by mouth 3 (three) times daily.     oxyCODONE-acetaminophen 10-325 MG per tablet  Commonly known as:  PERCOCET  Take 1 tablet by mouth every 4 (four) hours as needed for pain.     STUDY MEDICATION  Place 1 drop into both eyes every morning. Study for Glaucoma thru [facility]. Placebo or 0.02% or timolol maleate     STUDY MEDICATION  Place 1 drop into both eyes at bedtime. Study for Glaucoma thru [facility]. Placebo or 0.02% or timolol maleate         Signed: Katalina Magri D 11/22/2013, 7:17 AM

## 2013-11-22 NOTE — Progress Notes (Signed)
  Pt. Alert and oriented,follows simple instructions, denies pain. Incision area without swelling, redness or S/S of infection. Voiding adequate clear yellow urine. Moving all extremities well and vitals stable and documented. Patient discharged home with spouse. Lumbar surgery notes instructions given to patient and family member for home safety and precautions. Pt. and family stated understanding of instructions given.  

## 2013-11-27 ENCOUNTER — Encounter (HOSPITAL_COMMUNITY): Payer: Self-pay | Admitting: Neurosurgery

## 2015-10-12 DIAGNOSIS — M545 Low back pain: Secondary | ICD-10-CM | POA: Diagnosis not present

## 2015-10-12 DIAGNOSIS — M5416 Radiculopathy, lumbar region: Secondary | ICD-10-CM | POA: Diagnosis not present

## 2015-10-19 DIAGNOSIS — M545 Low back pain: Secondary | ICD-10-CM | POA: Diagnosis not present

## 2015-10-19 DIAGNOSIS — M5417 Radiculopathy, lumbosacral region: Secondary | ICD-10-CM | POA: Diagnosis not present

## 2015-10-19 DIAGNOSIS — M5416 Radiculopathy, lumbar region: Secondary | ICD-10-CM | POA: Diagnosis not present

## 2015-11-02 ENCOUNTER — Other Ambulatory Visit: Payer: Self-pay | Admitting: Anesthesiology

## 2015-11-10 ENCOUNTER — Encounter (HOSPITAL_COMMUNITY): Payer: Self-pay

## 2015-11-10 ENCOUNTER — Encounter (HOSPITAL_COMMUNITY)
Admission: RE | Admit: 2015-11-10 | Discharge: 2015-11-10 | Disposition: A | Discharge status: Home / Self Care | Payer: Medicare Other | Source: Ambulatory Visit | Attending: Anesthesiology | Admitting: Anesthesiology

## 2015-11-10 DIAGNOSIS — Z87891 Personal history of nicotine dependence: Secondary | ICD-10-CM | POA: Diagnosis not present

## 2015-11-10 DIAGNOSIS — N281 Cyst of kidney, acquired: Secondary | ICD-10-CM | POA: Diagnosis not present

## 2015-11-10 DIAGNOSIS — M5416 Radiculopathy, lumbar region: Secondary | ICD-10-CM | POA: Diagnosis not present

## 2015-11-10 DIAGNOSIS — E739 Lactose intolerance, unspecified: Secondary | ICD-10-CM | POA: Diagnosis not present

## 2015-11-10 DIAGNOSIS — M419 Scoliosis, unspecified: Secondary | ICD-10-CM | POA: Diagnosis not present

## 2015-11-10 DIAGNOSIS — G8929 Other chronic pain: Secondary | ICD-10-CM | POA: Diagnosis not present

## 2015-11-10 DIAGNOSIS — M961 Postlaminectomy syndrome, not elsewhere classified: Secondary | ICD-10-CM | POA: Diagnosis not present

## 2015-11-10 DIAGNOSIS — M1990 Unspecified osteoarthritis, unspecified site: Secondary | ICD-10-CM | POA: Diagnosis not present

## 2015-11-10 DIAGNOSIS — Z885 Allergy status to narcotic agent status: Secondary | ICD-10-CM | POA: Diagnosis not present

## 2015-11-10 DIAGNOSIS — Z853 Personal history of malignant neoplasm of breast: Secondary | ICD-10-CM | POA: Diagnosis not present

## 2015-11-10 DIAGNOSIS — M545 Low back pain: Secondary | ICD-10-CM | POA: Diagnosis not present

## 2015-11-10 LAB — CBC
HEMATOCRIT: 38.8 % (ref 36.0–46.0)
Hemoglobin: 13.2 g/dL (ref 12.0–15.0)
MCH: 30.2 pg (ref 26.0–34.0)
MCHC: 34 g/dL (ref 30.0–36.0)
MCV: 88.8 fL (ref 78.0–100.0)
PLATELETS: 159 10*3/uL (ref 150–400)
RBC: 4.37 MIL/uL (ref 3.87–5.11)
RDW: 12.6 % (ref 11.5–15.5)
WBC: 4.6 10*3/uL (ref 4.0–10.5)

## 2015-11-10 LAB — SURGICAL PCR SCREEN
MRSA, PCR: NEGATIVE
STAPHYLOCOCCUS AUREUS: NEGATIVE

## 2015-11-10 LAB — BASIC METABOLIC PANEL
ANION GAP: 9 (ref 5–15)
BUN: 7 mg/dL (ref 6–20)
CO2: 27 mmol/L (ref 22–32)
Calcium: 9.4 mg/dL (ref 8.9–10.3)
Chloride: 107 mmol/L (ref 101–111)
Creatinine, Ser: 0.52 mg/dL (ref 0.44–1.00)
GFR calc Af Amer: >60 mL/min (ref >60)
GLUCOSE: 88 mg/dL (ref 65–99)
POTASSIUM: 4.2 mmol/L (ref 3.5–5.1)
Sodium: 143 mmol/L (ref 135–145)

## 2015-11-10 NOTE — Pre-Procedure Instructions (Addendum)
[name]  11/10/2015      [facility] [place], [place] [address] [place] [address] Phone: [phone] Fax: [phone]    Your procedure is scheduled on 11-13-2015  Friday    Report to [facility] Admitting 7:30 at A.M.   Call this number if you have problems the morning of surgery:  [phone]   Remember:  Do not eat food or drink liquids after midnight.   Take these medicines the morning of surgery with A SIP OF WATER Valium if needed, pain medication if needed  STOP all herbel meds, nsaids (aleve,naproxen,advil,ibuprofen) starting today including vitamins,aspirin   Do not wear jewelry, make-up or nail polish.  Do not wear lotions, powders, or perfumes.  You may not wear deodorant.  Do not shave 48 hours prior to surgery.   .  Do not bring valuables to the hospital.  [facility] is not responsible for any belongings or valuables.  Contacts, dentures or bridgework may not be worn into surgery.  Leave your suitcase in the car.  After surgery it may be brought to your room.  For patients admitted to the hospital, discharge time will be determined by your treatment team.  Patients discharged the day of surgery will not be allowed to drive home.     Special Instructions: [facility] - Preparing for Surgery  Before surgery, you can play an important role.  Because skin is not sterile, your skin needs to be as free of germs as possible.  You can reduce the number of germs on you skin by washing with CHG (chlorahexidine gluconate) soap before surgery.  CHG is an antiseptic cleaner which kills germs and bonds with the skin to continue killing germs even after washing.  Please DO NOT use if you have an allergy to CHG or antibacterial soaps.  If your skin becomes reddened/irritated stop using the CHG and inform your nurse when you arrive at Short Stay.  Do not shave (including legs  and underarms) for at least 48 hours prior to the first CHG shower.  You may shave your face.  Please follow these instructions carefully:   1.  Shower with CHG Soap the night before surgery and the morning of Surgery.  2.  If you choose to wash your hair, wash your hair first as usual with your normal shampoo.  3.  After you shampoo, rinse your hair and body thoroughly to remove the Shampoo.  4.  Use CHG as you would any other liquid soap.  You can apply chg directly  to the skin and wash gently with scrungie or a clean washcloth.  5.  Apply the CHG Soap to your body ONLY FROM THE NECK DOWN.  Do not use on open wounds or open sores.  Avoid contact with your eyes ears, mouth and genitals (private parts).  Wash genitals (private parts)       with your normal soap.  6.  Wash thoroughly, paying special attention to the area where your surgery will be performed.  7.  Thoroughly rinse your body with warm water from the neck down.  8.  DO NOT shower/wash with your normal soap after using and rinsing off the CHG Soap.  9.  Pat yourself dry with a clean towel.            10.  Wear clean pajamas.  11.  Place clean sheets on your bed the night of your first shower and do not sleep with pets.  Day of Surgery  Do not apply any lotions/deodorants the morning of surgery.  Please wear clean clothes to the hospital/surgery center.  Please read over the following fact sheets that you were given. Pain Booklet and Surgical Site Infection Prevention,mrsa instructions

## 2015-11-12 MED ORDER — CEFAZOLIN SODIUM-DEXTROSE 2-3 GM-% IV SOLR
2.0000 g | INTRAVENOUS | Status: DC
  Filled 2015-11-12: qty 50

## 2015-11-12 NOTE — H&P (Signed)
[name] is an 64 y.o. female.   Chief Complaint: back pain with radiculopathy HPI: this 57-year-old woman with history of lumbar disc problems, status post surgery, with residual, now worsening pain particularly in the last 15 months. The patient has had chronic low back pain radiating into the left lower extremity for over a year.  She has been quite frustrated with her symptoms.  She has had surgical evaluation, physical therapy, injection therapy and multiple neuromodulator's with no improvement in her symptoms.  She has tried narcotic medications which generally create side effects for the patient.  She utilizes medicines very judiciously as they make her quite drowsy and unable to perform her daily activities. These limitations in her previous medical therapy, a trial spinal cord stimulator therapy was considered.  Patient underwent psychological evaluation, and was found to be a good candidate from that standpoint.  She underwent trial lead placement on November 7.  She returned stating that the trial was very helpful.  She reported at least an 80% improvement in her symptoms.  She utilized a stimulator mostly high-frequency and is able to tolerate it quite well.  She reports no issues during the trial.  She does not utilize her medications.  She was able to walk 2 miles on the [place] which was a big step for her.  She used to be an avid walker but has been unable to do this since her back pain began.  Today she rates her pain a one out of 10 with a stimulator on.   Past Medical History  Diagnosis Date  . Complication of anesthesia   . PONV (postoperative nausea and vomiting)   . Chronic kidney disease     cyst right kidney  . Cancer ([place])     left breast  . Arthritis   . Scoliosis   . Headache(784.0)     migraines hx  . Head ache     transient global amnesia after migraines 2013    Past Surgical History  Procedure Laterality Date  . Tonsillectomy  1959  . Sweat gland Right  1980    excision tumor from sweat gland  . Tubal ligation Bilateral 1988    Dr Heber [place]  . Tubal ligation    . Infart fallopian tube  1989     Dr Hoffman/ infarction  . Breast surgery Left 1999    Dr Irish Elders  . Lumbar laminectomy/decompression microdiscectomy Bilateral 11/21/2013    Procedure: Bilateral Lumbar three-four laminectomy for synovial cyst;  Surgeon: Ophelia Charter, MD;  Location: [facility];  Service: Neurosurgery;  Laterality: Bilateral;  Bilateral Lumbar three-four laminectomy for synovial cyst  . Back surgery  2010    microdectomy Dr. Arnoldo Morale  . Joint replacement Right 2003     Dr. Durward Fortes rt hip congenital defect    No family history on file. Social History:  reports that she quit smoking about 31 years ago. Her smoking use included Cigarettes. She smoked 0.50 packs per day. She has never used smokeless tobacco. She reports that she drinks alcohol. She reports that she does not use illicit drugs.  Allergies:  Allergies  Allergen Reactions  . Other Other (See Comments)    Some lactose intolerance.   . Oxycodone Itching    Low level itching when take 1/2 oxycodone     Medications Prior to Admission  Medication Sig Dispense Refill  . Biotin 1 MG CAPS Take 1 mg by mouth 1 day or 1 dose.    Marland Kitchen  calcium carbonate (TUMS EX) 750 MG chewable tablet Chew 2 tablets by mouth 1 day or 1 dose.    . cholecalciferol (VITAMIN D) 1000 UNITS tablet Take 1,000 Units by mouth daily. d3    . diazepam (VALIUM) 5 MG tablet Take 1 tablet (5 mg total) by mouth every 6 (six) hours as needed for muscle spasms. 50 tablet 1  . oxyCODONE-acetaminophen (PERCOCET) 10-325 MG per tablet Take 1 tablet by mouth every 4 (four) hours as needed for pain. 100 tablet 0    No results found for this or any previous visit (from the past 48 hour(s)). No results found.  Review of Systems  Constitutional: Negative.   HENT: Negative.   Eyes: Negative.   Respiratory: Negative.   Cardiovascular:  Negative.   Gastrointestinal: Negative.   Genitourinary: Negative.   Musculoskeletal: Positive for back pain and joint pain. Negative for myalgias and falls.  Skin: Negative.   Neurological: Negative.   Endo/Heme/Allergies: Negative.   Psychiatric/Behavioral: Negative.     Height 5\' 7"  (1.702 m), weight 68.357 kg (150 lb 11.2 oz). Physical Exam  Constitutional: She is oriented to person, place, and time. She appears well-developed and well-nourished.  HENT:  Head: Normocephalic and atraumatic.  Eyes: EOM are normal. Pupils are equal, round, and reactive to light.  Neck: Normal range of motion.  Cardiovascular: Normal rate and regular rhythm.   Musculoskeletal: Normal range of motion.  Neurological: She is alert and oriented to person, place, and time.  Skin: Skin is warm and dry.  Psychiatric: She has a normal mood and affect. Her behavior is normal. Thought content normal.     Assessment/Plan 1) lumbago with radiculopathy, left side dominant 2) lumbar post-laminectomy syndrome 3) chronic pain  PLAN: permanent SCS, [employer]  Bonna Gains 11/13/2015, 9:26 AM

## 2015-11-13 ENCOUNTER — Ambulatory Visit (HOSPITAL_COMMUNITY): Payer: Medicare Other | Admitting: Certified Registered"

## 2015-11-13 ENCOUNTER — Encounter (HOSPITAL_COMMUNITY): Payer: Self-pay | Admitting: *Deleted

## 2015-11-13 ENCOUNTER — Ambulatory Visit (HOSPITAL_COMMUNITY)
Admission: RE | Admit: 2015-11-13 | Discharge: 2015-11-13 | Disposition: A | Discharge status: Home / Self Care | Payer: Medicare Other | Source: Ambulatory Visit | Attending: Anesthesiology | Admitting: Anesthesiology

## 2015-11-13 ENCOUNTER — Encounter (HOSPITAL_COMMUNITY)
Admission: RE | Disposition: A | Discharge status: Home / Self Care | Payer: Self-pay | Source: Ambulatory Visit | Attending: Anesthesiology

## 2015-11-13 ENCOUNTER — Ambulatory Visit (HOSPITAL_COMMUNITY): Payer: Medicare Other

## 2015-11-13 DIAGNOSIS — Z87891 Personal history of nicotine dependence: Secondary | ICD-10-CM | POA: Insufficient documentation

## 2015-11-13 DIAGNOSIS — M419 Scoliosis, unspecified: Secondary | ICD-10-CM | POA: Diagnosis not present

## 2015-11-13 DIAGNOSIS — M5416 Radiculopathy, lumbar region: Secondary | ICD-10-CM | POA: Diagnosis not present

## 2015-11-13 DIAGNOSIS — Z853 Personal history of malignant neoplasm of breast: Secondary | ICD-10-CM | POA: Diagnosis not present

## 2015-11-13 DIAGNOSIS — M5417 Radiculopathy, lumbosacral region: Secondary | ICD-10-CM | POA: Diagnosis not present

## 2015-11-13 DIAGNOSIS — Z885 Allergy status to narcotic agent status: Secondary | ICD-10-CM | POA: Insufficient documentation

## 2015-11-13 DIAGNOSIS — M549 Dorsalgia, unspecified: Secondary | ICD-10-CM | POA: Diagnosis not present

## 2015-11-13 DIAGNOSIS — M1990 Unspecified osteoarthritis, unspecified site: Secondary | ICD-10-CM | POA: Insufficient documentation

## 2015-11-13 DIAGNOSIS — N281 Cyst of kidney, acquired: Secondary | ICD-10-CM | POA: Insufficient documentation

## 2015-11-13 DIAGNOSIS — M545 Low back pain: Secondary | ICD-10-CM | POA: Diagnosis not present

## 2015-11-13 DIAGNOSIS — M961 Postlaminectomy syndrome, not elsewhere classified: Secondary | ICD-10-CM | POA: Insufficient documentation

## 2015-11-13 DIAGNOSIS — E739 Lactose intolerance, unspecified: Secondary | ICD-10-CM | POA: Diagnosis not present

## 2015-11-13 DIAGNOSIS — G8929 Other chronic pain: Secondary | ICD-10-CM | POA: Insufficient documentation

## 2015-11-13 DIAGNOSIS — N189 Chronic kidney disease, unspecified: Secondary | ICD-10-CM | POA: Diagnosis not present

## 2015-11-13 DIAGNOSIS — Z969 Presence of functional implant, unspecified: Secondary | ICD-10-CM | POA: Diagnosis not present

## 2015-11-13 HISTORY — PX: SPINAL CORD STIMULATOR INSERTION: SHX5378

## 2015-11-13 SURGERY — INSERTION, SPINAL CORD STIMULATOR, LUMBAR
Anesthesia: Monitor Anesthesia Care | Site: Back

## 2015-11-13 MED ORDER — OXYCODONE-ACETAMINOPHEN 10-325 MG PO TABS: 1.0000 | ORAL_TABLET | ORAL | Status: AC | PRN

## 2015-11-13 MED ORDER — PROMETHAZINE HCL 25 MG/ML IJ SOLN: 6.2500 mg | INTRAMUSCULAR | Status: DC | PRN

## 2015-11-13 MED ORDER — 0.9 % SODIUM CHLORIDE (POUR BTL) OPTIME
TOPICAL | Status: DC | PRN
  Administered 2015-11-13: 1000 mL

## 2015-11-13 MED ORDER — HYDROMORPHONE HCL 1 MG/ML IJ SOLN: 0.2500 mg | INTRAMUSCULAR | Status: DC | PRN

## 2015-11-13 MED ORDER — FENTANYL CITRATE (PF) 250 MCG/5ML IJ SOLN
INTRAMUSCULAR | Status: DC | PRN
  Administered 2015-11-13 (×2): 25 ug via INTRAVENOUS
  Administered 2015-11-13: 50 ug via INTRAVENOUS

## 2015-11-13 MED ORDER — ONDANSETRON HCL 4 MG/2ML IJ SOLN
INTRAMUSCULAR | Status: AC
  Filled 2015-11-13: qty 2

## 2015-11-13 MED ORDER — LIDOCAINE HCL (CARDIAC) 20 MG/ML IV SOLN
INTRAVENOUS | Status: DC | PRN
  Administered 2015-11-13: 60 mg via INTRAVENOUS

## 2015-11-13 MED ORDER — LACTATED RINGERS IV SOLN
INTRAVENOUS | Status: DC
  Administered 2015-11-13 (×2): via INTRAVENOUS

## 2015-11-13 MED ORDER — SODIUM CHLORIDE 0.9 % IR SOLN
Status: DC | PRN
  Administered 2015-11-13: 500 mL

## 2015-11-13 MED ORDER — CEPHALEXIN 500 MG PO CAPS: 500.0000 mg | ORAL_CAPSULE | Freq: Three times a day (TID) | ORAL | Status: AC

## 2015-11-13 MED ORDER — HYDROMORPHONE HCL 1 MG/ML IJ SOLN
INTRAMUSCULAR | Status: AC
  Filled 2015-11-13: qty 1

## 2015-11-13 MED ORDER — MIDAZOLAM HCL 2 MG/2ML IJ SOLN
INTRAMUSCULAR | Status: AC
  Filled 2015-11-13: qty 2

## 2015-11-13 MED ORDER — FENTANYL CITRATE (PF) 250 MCG/5ML IJ SOLN
INTRAMUSCULAR | Status: AC
  Filled 2015-11-13: qty 5

## 2015-11-13 MED ORDER — OXYCODONE HCL 5 MG PO TABS
ORAL_TABLET | ORAL | Status: AC
  Filled 2015-11-13: qty 1

## 2015-11-13 MED ORDER — PROPOFOL 500 MG/50ML IV EMUL
INTRAVENOUS | Status: DC | PRN
  Administered 2015-11-13: 100 ug/kg/min via INTRAVENOUS

## 2015-11-13 MED ORDER — MIDAZOLAM HCL 5 MG/5ML IJ SOLN
INTRAMUSCULAR | Status: DC | PRN
  Administered 2015-11-13: 2 mg via INTRAVENOUS

## 2015-11-13 MED ORDER — OXYCODONE HCL 5 MG PO TABS
5.0000 mg | ORAL_TABLET | Freq: Once | ORAL | Status: AC
  Administered 2015-11-13: 5 mg via ORAL

## 2015-11-13 MED ORDER — BUPIVACAINE-EPINEPHRINE (PF) 0.5% -1:200000 IJ SOLN
INTRAMUSCULAR | Status: DC | PRN
  Administered 2015-11-13: 40 mL via PERINEURAL

## 2015-11-13 MED ORDER — LIDOCAINE HCL (CARDIAC) 20 MG/ML IV SOLN
INTRAVENOUS | Status: AC
  Filled 2015-11-13: qty 5

## 2015-11-13 SURGICAL SUPPLY — 65 items
ANCH LD 4 SETX2 CLIK X (Stimulator) ×1 IMPLANT
ANCHOR CLIK X NEURO (Stimulator) ×1 IMPLANT
APL SKNCLS STERI-STRIP NONHPOA (GAUZE/BANDAGES/DRESSINGS)
BAG DECANTER FOR FLEXI CONT (MISCELLANEOUS) ×2 IMPLANT
BENZOIN TINCTURE PRP APPL 2/3 (GAUZE/BANDAGES/DRESSINGS) IMPLANT
BINDER ABDOMINAL 12 ML 46-62 (SOFTGOODS) ×2 IMPLANT
BLADE CLIPPER SURG (BLADE) IMPLANT
CABLE/EXTENSION OR 1X16 61 (CABLE) ×2 IMPLANT
CHLORAPREP W/TINT 26ML (MISCELLANEOUS) ×2 IMPLANT
CLIP TI WIDE RED SMALL 6 (CLIP) IMPLANT
DRAPE C-ARM 42X72 X-RAY (DRAPES) ×2 IMPLANT
DRAPE C-ARMOR (DRAPES) ×2 IMPLANT
DRAPE LAPAROTOMY 100X72X124 (DRAPES) ×2 IMPLANT
DRAPE POUCH INSTRU U-SHP 10X18 (DRAPES) ×2 IMPLANT
DRAPE SURG 17X23 STRL (DRAPES) ×2 IMPLANT
DRSG OPSITE POSTOP 3X4 (GAUZE/BANDAGES/DRESSINGS) IMPLANT
DRSG OPSITE POSTOP 4X6 (GAUZE/BANDAGES/DRESSINGS) IMPLANT
ELECT REM PT RETURN 9FT ADLT (ELECTROSURGICAL) ×2
ELECTRODE REM PT RTRN 9FT ADLT (ELECTROSURGICAL) ×1 IMPLANT
GAUZE SPONGE 4X4 16PLY XRAY LF (GAUZE/BANDAGES/DRESSINGS) ×2 IMPLANT
GLOVE BIOGEL PI IND STRL 7.5 (GLOVE) ×1 IMPLANT
GLOVE BIOGEL PI INDICATOR 7.5 (GLOVE) ×1
GLOVE ECLIPSE 7.5 STRL STRAW (GLOVE) ×2 IMPLANT
GLOVE EXAM NITRILE LRG STRL (GLOVE) IMPLANT
GLOVE EXAM NITRILE MD LF STRL (GLOVE) IMPLANT
GLOVE EXAM NITRILE XL STR (GLOVE) IMPLANT
GLOVE EXAM NITRILE XS STR PU (GLOVE) IMPLANT
GOWN STRL REUS W/ TWL LRG LVL3 (GOWN DISPOSABLE) IMPLANT
GOWN STRL REUS W/ TWL XL LVL3 (GOWN DISPOSABLE) IMPLANT
GOWN STRL REUS W/TWL 2XL LVL3 (GOWN DISPOSABLE) IMPLANT
GOWN STRL REUS W/TWL LRG LVL3 (GOWN DISPOSABLE)
GOWN STRL REUS W/TWL XL LVL3 (GOWN DISPOSABLE)
IPG PRECISION SPECTRA (Stimulator) ×1 IMPLANT
KIT BASIN OR (CUSTOM PROCEDURE TRAY) ×2 IMPLANT
KIT CHARGING (KITS) ×1
KIT CHARGING PRECISION NEURO (KITS) IMPLANT
KIT PAT PROGRAM FREELINK (KITS) IMPLANT
KIT ROOM TURNOVER OR (KITS) ×2 IMPLANT
KIT SPLITTER 30CM 2X8 (Stimulator) ×2 IMPLANT
LEAD KIT CONTACT INFINION 16 (Stimulator) ×2 IMPLANT
LIQUID BAND (GAUZE/BANDAGES/DRESSINGS) ×2 IMPLANT
NDL 18GX1X1/2 (RX/OR ONLY) (NEEDLE) IMPLANT
NDL HYPO 25X1 1.5 SAFETY (NEEDLE) ×1 IMPLANT
NEEDLE 18GX1X1/2 (RX/OR ONLY) (NEEDLE) IMPLANT
NEEDLE HYPO 25X1 1.5 SAFETY (NEEDLE) ×2 IMPLANT
NS IRRIG 1000ML POUR BTL (IV SOLUTION) ×2 IMPLANT
PACK LAMINECTOMY NEURO (CUSTOM PROCEDURE TRAY) ×2 IMPLANT
PAD ARMBOARD 7.5X6 YLW CONV (MISCELLANEOUS) ×2 IMPLANT
REMOTE CONTROL KIT (KITS) ×2
SPONGE LAP 4X18 X RAY DECT (DISPOSABLE) ×2 IMPLANT
SPONGE SURGIFOAM ABS GEL SZ50 (HEMOSTASIS) IMPLANT
STAPLER SKIN PROX WIDE 3.9 (STAPLE) ×2 IMPLANT
STRIP CLOSURE SKIN 1/2X4 (GAUZE/BANDAGES/DRESSINGS) IMPLANT
SUT MNCRL AB 4-0 PS2 18 (SUTURE) IMPLANT
SUT SILK 0 (SUTURE) ×2
SUT SILK 0 MO-6 18XCR BRD 8 (SUTURE) ×1 IMPLANT
SUT SILK 0 TIES 10X30 (SUTURE) IMPLANT
SUT SILK 2 0 TIES 10X30 (SUTURE) IMPLANT
SUT VIC AB 2-0 CP2 18 (SUTURE) ×4 IMPLANT
SYR EPIDURAL 5ML GLASS (SYRINGE) ×2 IMPLANT
SYRINGE 10CC LL (SYRINGE) IMPLANT
TOWEL OR 17X24 6PK STRL BLUE (TOWEL DISPOSABLE) ×2 IMPLANT
TOWEL OR 17X26 10 PK STRL BLUE (TOWEL DISPOSABLE) ×2 IMPLANT
WATER STERILE IRR 1000ML POUR (IV SOLUTION) ×2 IMPLANT
YANKAUER SUCT BULB TIP NO VENT (SUCTIONS) ×2 IMPLANT

## 2015-11-13 NOTE — Anesthesia Preprocedure Evaluation (Addendum)
Anesthesia Evaluation  Patient identified by MRN, date of birth, ID band Patient awake    Reviewed: Allergy & Precautions, H&P , NPO status , Patient's Chart, lab work & pertinent test results  History of Anesthesia Complications (+) PONV and history of anesthetic complications  Airway Mallampati: II  TM Distance: >3 FB Neck ROM: full    Dental  (+) Teeth Intact, Dental Advisory Given   Pulmonary former smoker,    Pulmonary exam normal        Cardiovascular negative cardio ROS Normal cardiovascular exam     Neuro/Psych  Headaches, negative psych ROS   GI/Hepatic negative GI ROS, Neg liver ROS,   Endo/Other    Renal/GU Renal InsufficiencyRenal disease     Musculoskeletal  (+) Arthritis ,   Abdominal   Peds  Hematology   Anesthesia Other Findings   Reproductive/Obstetrics                            Anesthesia Physical  Anesthesia Plan  ASA: II  Anesthesia Plan: MAC   Post-op Pain Management:    Induction: Intravenous  Airway Management Planned: Simple Face Mask  Additional Equipment:   Intra-op Plan:   Post-operative Plan:   Informed Consent: I have reviewed the patients History and Physical, chart, labs and discussed the procedure including the risks, benefits and alternatives for the proposed anesthesia with the patient or authorized representative who has indicated his/her understanding and acceptance.   Dental advisory given  Plan Discussed with: CRNA, Anesthesiologist and Surgeon  Anesthesia Plan Comments:         Anesthesia Quick Evaluation

## 2015-11-13 NOTE — Op Note (Signed)
PREOP DX: 1) lumbago  2) lumbar radiculopathy  3) lumbar post-laminectomy syndrome  4) chronic pain  POSTOP DX: 1) lumbago  2) lumbar radiculopathy  3) lumbar post-laminectomy syndrome  4) chronic pain  PROCEDURES PERFORMED:1) intraop fluoro 2) placement of 2 16 contact [employer] Infinion leads 3) placement of Spectra SCS generator  SURGEON:Ranae Casebier  ASSISTANT: NONE  ANESTHESIA: MAC  EBL: <20cc  DESCRIPTION OF PROCEDURE: After a discussion of risks, benefits and alternatives, informed consent was obtained. The patient was taken to the OR, turned prone onto a Jackson table, all pressure points padded, SCD's placed, and an adequate plane of anesthesia induced. A timeout was taken to verify the correct patient, position, personnel, availability of appropriate equipment, and administration of perioperative antibiotics.  The thoracic and lumbar areas were widely prepped with chloraprep and draped into a sterile field. Fluoroscopy was used to plan a right paramedian incision at the L1-L3 levels, and an incision made with a 10 blade and carried down to the dorsolumbar fascia with the bovie and blunt dissection. Retractors were placed and a 14g [employer] tuohy needle placed into the epidural space at the L1-2 interspace using biplanar fluoro and loss-of-resistance technique. The needle was aspirated without any return of fluid. A [employer] INFINION lead was introduced and under live AP fluoro advanced until the distal-most contact overlay the caudad aspect of the T7 vertebral body shadow with the rest of the contacts distributed over the T8 and T9 vertebral bodies in a position just left of anatomic midline. A second Infinion lead was placed just at anatomic midline in the same levels using the same technique. The patient was awakened and the leads tested; impedances were good, and the patient reported good coverage with amplitudes in the 3-7 mA range. 0 silk sutures were  placed in the fascia adjacent to the needles. The needles and stylets were removed under fluoroscopy with no lead migration noted. Leads were then fixed to the fascia with Clik anchors; repeat images were obtained to verify that there had been no lead migration.  The incision was inspected and hemostasis obtained with the bipolar cautery.  Attention was then turned to creation of a subcutaneous pocket. At the right flank, a 3 cm incision was made with a 10 blade and using the bovie and blunt dissection a pocket of size appropriate to place a SCS generator. The pocket was trialed, and found to be of adequate size. The pocket was inspected for hemostasis, which was found to be excellent. Using reverse seldinger technique, the leads were tunneled to the pocket site, and the leads inserted into the SCS generator. Impedances were checked, and all found to be excellent. The leads were then all fixed into position with a self-torquing wrench. The wiring was all carefully coiled, placed behind the generator and placed in the pocket.  Both incisions were copiously irrigated with bacitracin-containing irrigation. The lumbar incision was closed in 2 deep layers of interrupted 2-0 vicryl and the skin closed with staples. The pocket incision was closed with a deeper layer of 2-0 vicryl interrupted sutures, and the skin closed with staples. Antibiotic ointment and sterile dressings were applied. Needle, sponge, and instrument counts were correct x2 at the end of the case.  The patient was then carefully awakened from anesthesia, turned supine, an abdominal binder placed, and the patient taken to the recovery room where she underwent complex spinal cord stimulator programming.  COMPLICATIONS: NONE  CONDITION: Stable throughout the course of the procedure and  immediately afterward  DISPOSITION: discharge to home, with antibiotics and pain medicine. Discussed care with the patient and her family. Followup in clinic  will be scheduled in 10-14 days.

## 2015-11-13 NOTE — Anesthesia Postprocedure Evaluation (Signed)
Anesthesia Post Note  Patient: [name]  Procedure(s) Performed: Procedure(s) (LRB): LUMBAR SPINAL CORD STIMULATOR INSERTION-PERMANENT (N/A)  Patient location during evaluation: PACU Anesthesia Type: MAC Level of consciousness: awake and alert Pain management: pain level controlled Vital Signs Assessment: post-procedure vital signs reviewed and stable Respiratory status: spontaneous breathing, nonlabored ventilation, respiratory function stable and patient connected to nasal cannula oxygen Cardiovascular status: stable and blood pressure returned to baseline Anesthetic complications: no    Last Vitals:  Filed Vitals:   11/13/15 1211 11/13/15 1215  BP: 121/74   Pulse: 76   Temp:  36.1 C  Resp: 16     Last Pain:  Filed Vitals:   11/13/15 1240  PainSc: 3                  Mckena Chern DANIEL

## 2015-11-13 NOTE — Transfer of Care (Signed)
Immediate Anesthesia Transfer of Care Note  Patient: [name]  Procedure(s) Performed: Procedure(s) with comments: LUMBAR SPINAL CORD STIMULATOR INSERTION-PERMANENT (N/A) - [place] INSERTION-PERMANENT  Patient Location: PACU  Anesthesia Type:MAC  Level of Consciousness: awake, alert , oriented and patient cooperative  Airway & Oxygen Therapy: Patient Spontanous Breathing and Patient connected to nasal cannula oxygen  Post-op Assessment: Report given to RN and Post -op Vital signs reviewed and stable  Post vital signs: Reviewed and stable  Last Vitals: There were no vitals filed for this visit.  Complications: No apparent anesthesia complications

## 2015-11-13 NOTE — Discharge Instructions (Signed)
Dr. Jury Caserta Post-Op Orders ° °• Ice Pack - 20 minutes on (in a pillow case), and 20 minutes off. Wear the ice pack UNDER the binder. °• Follow up in office, they will call you for an appointment in 10 days to 2 weeks. °• Increase activity gradually.   °• No lifting anything heavier than a gallon of milk (10 pounds) until seen in the office. °• Advance diet slowly as tolerated. °• Dressing care:  Keep dressing dry for 3 days, and on Post-op day 4, may shower. °• Call for fever, drainage, and redness. °• No swimming or bathing in a bathtub (do not get into standing water). °•  °

## 2015-11-16 ENCOUNTER — Encounter (HOSPITAL_COMMUNITY): Payer: Self-pay | Admitting: Anesthesiology

## 2015-11-25 DIAGNOSIS — M5416 Radiculopathy, lumbar region: Secondary | ICD-10-CM | POA: Diagnosis not present

## 2015-11-25 DIAGNOSIS — M545 Low back pain: Secondary | ICD-10-CM | POA: Diagnosis not present

## 2015-11-25 DIAGNOSIS — M961 Postlaminectomy syndrome, not elsewhere classified: Secondary | ICD-10-CM | POA: Diagnosis not present

## 2015-11-25 DIAGNOSIS — M5417 Radiculopathy, lumbosacral region: Secondary | ICD-10-CM | POA: Diagnosis not present

## 2015-12-23 DIAGNOSIS — Z9689 Presence of other specified functional implants: Secondary | ICD-10-CM | POA: Diagnosis not present

## 2015-12-23 DIAGNOSIS — M545 Low back pain: Secondary | ICD-10-CM | POA: Diagnosis not present

## 2015-12-23 DIAGNOSIS — M5417 Radiculopathy, lumbosacral region: Secondary | ICD-10-CM | POA: Diagnosis not present

## 2016-01-29 DIAGNOSIS — H04123 Dry eye syndrome of bilateral lacrimal glands: Secondary | ICD-10-CM | POA: Diagnosis not present

## 2016-01-29 DIAGNOSIS — H40003 Preglaucoma, unspecified, bilateral: Secondary | ICD-10-CM | POA: Diagnosis not present

## 2016-01-29 DIAGNOSIS — Z83511 Family history of glaucoma: Secondary | ICD-10-CM | POA: Diagnosis not present

## 2016-02-16 DIAGNOSIS — Z139 Encounter for screening, unspecified: Secondary | ICD-10-CM | POA: Diagnosis not present

## 2016-02-16 DIAGNOSIS — Z1231 Encounter for screening mammogram for malignant neoplasm of breast: Secondary | ICD-10-CM | POA: Diagnosis not present

## 2016-05-19 DIAGNOSIS — H40003 Preglaucoma, unspecified, bilateral: Secondary | ICD-10-CM | POA: Diagnosis not present

## 2016-06-01 DIAGNOSIS — L570 Actinic keratosis: Secondary | ICD-10-CM | POA: Diagnosis not present

## 2016-06-01 DIAGNOSIS — Z85828 Personal history of other malignant neoplasm of skin: Secondary | ICD-10-CM | POA: Diagnosis not present

## 2016-06-01 DIAGNOSIS — Z08 Encounter for follow-up examination after completed treatment for malignant neoplasm: Secondary | ICD-10-CM | POA: Diagnosis not present

## 2016-08-29 DIAGNOSIS — M5417 Radiculopathy, lumbosacral region: Secondary | ICD-10-CM | POA: Diagnosis not present

## 2016-08-29 DIAGNOSIS — M961 Postlaminectomy syndrome, not elsewhere classified: Secondary | ICD-10-CM | POA: Diagnosis not present

## 2016-08-29 DIAGNOSIS — M5416 Radiculopathy, lumbar region: Secondary | ICD-10-CM | POA: Diagnosis not present

## 2016-08-29 DIAGNOSIS — M545 Low back pain: Secondary | ICD-10-CM | POA: Diagnosis not present

## 2016-09-20 DIAGNOSIS — R3129 Other microscopic hematuria: Secondary | ICD-10-CM | POA: Diagnosis not present

## 2016-09-20 DIAGNOSIS — R823 Hemoglobinuria: Secondary | ICD-10-CM | POA: Diagnosis not present

## 2016-10-06 DIAGNOSIS — R3129 Other microscopic hematuria: Secondary | ICD-10-CM | POA: Diagnosis not present

## 2016-10-06 DIAGNOSIS — N359 Urethral stricture, unspecified: Secondary | ICD-10-CM | POA: Diagnosis not present

## 2016-10-26 DIAGNOSIS — N359 Urethral stricture, unspecified: Secondary | ICD-10-CM | POA: Diagnosis not present

## 2016-10-26 DIAGNOSIS — R339 Retention of urine, unspecified: Secondary | ICD-10-CM | POA: Diagnosis not present

## 2016-11-08 DIAGNOSIS — Z13220 Encounter for screening for lipoid disorders: Secondary | ICD-10-CM | POA: Diagnosis not present

## 2016-11-08 DIAGNOSIS — Z1329 Encounter for screening for other suspected endocrine disorder: Secondary | ICD-10-CM | POA: Diagnosis not present

## 2016-11-08 DIAGNOSIS — Z23 Encounter for immunization: Secondary | ICD-10-CM | POA: Diagnosis not present

## 2016-11-08 DIAGNOSIS — Z0000 Encounter for general adult medical examination without abnormal findings: Secondary | ICD-10-CM | POA: Diagnosis not present

## 2016-12-04 DIAGNOSIS — Z6824 Body mass index (BMI) 24.0-24.9, adult: Secondary | ICD-10-CM | POA: Diagnosis not present

## 2016-12-04 DIAGNOSIS — J029 Acute pharyngitis, unspecified: Secondary | ICD-10-CM | POA: Diagnosis not present

## 2016-12-04 DIAGNOSIS — J040 Acute laryngitis: Secondary | ICD-10-CM | POA: Diagnosis not present

## 2016-12-04 DIAGNOSIS — J0100 Acute maxillary sinusitis, unspecified: Secondary | ICD-10-CM | POA: Diagnosis not present

## 2016-12-14 DIAGNOSIS — L570 Actinic keratosis: Secondary | ICD-10-CM | POA: Diagnosis not present

## 2016-12-14 DIAGNOSIS — Z08 Encounter for follow-up examination after completed treatment for malignant neoplasm: Secondary | ICD-10-CM | POA: Diagnosis not present

## 2016-12-14 DIAGNOSIS — Z85828 Personal history of other malignant neoplasm of skin: Secondary | ICD-10-CM | POA: Diagnosis not present

## 2017-01-07 ENCOUNTER — Encounter (HOSPITAL_COMMUNITY): Payer: Self-pay | Admitting: Emergency Medicine

## 2017-01-07 ENCOUNTER — Emergency Department (HOSPITAL_COMMUNITY): Payer: Medicare Other

## 2017-01-07 ENCOUNTER — Emergency Department (HOSPITAL_COMMUNITY)
Admission: EM | Admit: 2017-01-07 | Discharge: 2017-01-07 | Disposition: A | Discharge status: Home / Self Care | Payer: Medicare Other | Attending: Emergency Medicine | Admitting: Emergency Medicine

## 2017-01-07 DIAGNOSIS — N189 Chronic kidney disease, unspecified: Secondary | ICD-10-CM | POA: Insufficient documentation

## 2017-01-07 DIAGNOSIS — M545 Low back pain: Secondary | ICD-10-CM | POA: Insufficient documentation

## 2017-01-07 DIAGNOSIS — Z87891 Personal history of nicotine dependence: Secondary | ICD-10-CM | POA: Insufficient documentation

## 2017-01-07 DIAGNOSIS — M5440 Lumbago with sciatica, unspecified side: Secondary | ICD-10-CM | POA: Diagnosis not present

## 2017-01-07 DIAGNOSIS — Z853 Personal history of malignant neoplasm of breast: Secondary | ICD-10-CM | POA: Diagnosis not present

## 2017-01-07 DIAGNOSIS — Z96641 Presence of right artificial hip joint: Secondary | ICD-10-CM | POA: Diagnosis not present

## 2017-01-07 MED ORDER — LORAZEPAM 2 MG/ML IJ SOLN
0.5000 mg | Freq: Once | INTRAMUSCULAR | Status: AC
  Administered 2017-01-07: 0.5 mg via INTRAVENOUS
  Filled 2017-01-07: qty 1

## 2017-01-07 MED ORDER — HYDROMORPHONE HCL 2 MG/ML IJ SOLN
1.0000 mg | Freq: Once | INTRAMUSCULAR | Status: AC
  Administered 2017-01-07: 1 mg via INTRAVENOUS
  Filled 2017-01-07: qty 1

## 2017-01-07 MED ORDER — LORAZEPAM 2 MG/ML IJ SOLN: 1.0000 mg | Freq: Once | INTRAMUSCULAR | Status: DC

## 2017-01-07 MED ORDER — METHOCARBAMOL 500 MG PO TABS
1000.0000 mg | ORAL_TABLET | Freq: Once | ORAL | Status: AC
  Administered 2017-01-07: 1000 mg via ORAL
  Filled 2017-01-07: qty 2

## 2017-01-07 MED ORDER — LIDOCAINE 5 % EX PTCH
1.0000 | MEDICATED_PATCH | Freq: Once | CUTANEOUS | Status: DC
  Administered 2017-01-07: 1 via TRANSDERMAL
  Filled 2017-01-07: qty 1

## 2017-01-07 MED ORDER — OXYCODONE HCL 15 MG PO TABS: 15.0000 mg | ORAL_TABLET | ORAL | 0 refills | Status: AC | PRN

## 2017-01-07 MED ORDER — DIAZEPAM 5 MG PO TABS: 5.0000 mg | ORAL_TABLET | Freq: Four times a day (QID) | ORAL | 0 refills | Status: AC | PRN

## 2017-01-07 NOTE — ED Triage Notes (Signed)
Pt. Stated, I have a major history with back pain. I started having major back pain yesterday to the point were I can't stand, sit or anything it hurts so bad. I took an Oxycocone at 1000 and has done nothing for pain.  I also have a nerve stimulator , so she is restricted to have a MRI

## 2017-01-07 NOTE — ED Notes (Signed)
Pt attempts to ambulate. Pt in able to stand with assistance but cannot take any steps.

## 2017-01-07 NOTE — ED Provider Notes (Addendum)
[facility] DEPT Provider Note   CSN: [id number] Arrival date & time: 01/07/17  1109     History   Chief Complaint Chief Complaint  Patient presents with  . Back Pain    HPI   Blood pressure 126/71, pulse 87, temperature 98.6 F (37 C), temperature source Oral, resp. rate 16, height 5\' 7"  (1.702 m), weight 65.8 kg, SpO2 100 %.  [name] is a 66 y.o. female complaining of severe atraumatic low back pain onset yesterday. She states that she has been taking oxycodone 10 mg she had 3 yesterday she also took a Flexeril and one of her husband's hydrocodone with little relief. She has a history of chronic back pain that is normally not this severe, she normally only takes 5 mg of oxycodone twice a day. She has a spinal stimulator implanted and is followed by neurosurgeon Dr. Arnoldo Morale. He follows with pain management Dr. Maryjean Ka. She hasn't have any urinary symptoms, fever, chills. She does have a history of breast cancer in full remission. She states that the pain is exacerbated by weightbearing on her heels and she's been weightbearing on the right foot on the toes over the last day. She and lids with a walker at her baseline.   Past Medical History:  Diagnosis Date  . Arthritis   . Cancer ([place])    left breast  . Chronic kidney disease    cyst right kidney  . Complication of anesthesia   . Head ache    transient global amnesia after migraines 2013  . Headache(784.0)    migraines hx  . PONV (postoperative nausea and vomiting)   . Scoliosis     Patient Active Problem List   Diagnosis Date Noted  . Synovial cyst of lumbar facet joint 11/21/2013    Past Surgical History:  Procedure Laterality Date  . BACK SURGERY  2010   microdectomy Dr. Arnoldo Morale  . BREAST SURGERY Left 1999   Dr Irish Elders  . infart fallopian tube  1989    Dr Hoffman/ infarction  . JOINT REPLACEMENT Right 2003    Dr. Durward Fortes rt hip congenital defect  . LUMBAR LAMINECTOMY/DECOMPRESSION MICRODISCECTOMY  Bilateral 11/21/2013   Procedure: Bilateral Lumbar three-four laminectomy for synovial cyst;  Surgeon: Ophelia Charter, MD;  Location: [facility];  Service: Neurosurgery;  Laterality: Bilateral;  Bilateral Lumbar three-four laminectomy for synovial cyst  . SPINAL CORD STIMULATOR INSERTION N/A 11/13/2015   Procedure: LUMBAR SPINAL CORD STIMULATOR INSERTION-PERMANENT;  Surgeon: Clydell Hakim, MD;  Location: [facility];  Service: Neurosurgery;  Laterality: N/A;  LUMBAR SPINAL CORD STIMULATOR INSERTION-PERMANENT  . sweat gland Right 1980   excision tumor from sweat gland  . TONSILLECTOMY  1959  . TUBAL LIGATION Bilateral 1988   Dr Heber [place]  . TUBAL LIGATION      OB History    No data available       Home Medications    Prior to Admission medications   Medication Sig Start Date End Date Taking? Authorizing Provider  Biotin 1 MG CAPS Take 1 mg by mouth daily.    Yes Historical Provider, MD  calcium carbonate (TUMS EX) 750 MG chewable tablet Chew 2 tablets by mouth daily.    Yes Historical Provider, MD  cholecalciferol (VITAMIN D) 1000 UNITS tablet Take 1,000 Units by mouth daily. d3   Yes Historical Provider, MD  cyclobenzaprine (FLEXERIL) 5 MG tablet Take 5 mg by mouth 3 (three) times daily as needed for muscle spasms.   Yes Historical  Provider, MD  Hypromellose (ARTIFICIAL TEARS OP) Place 1 drop into both eyes daily as needed (dry eyes).   Yes Historical Provider, MD  Melatonin 5 MG CAPS Take 5 mg by mouth daily.   Yes Historical Provider, MD  oxyCODONE-acetaminophen (PERCOCET) 10-325 MG per tablet Take 1 tablet by mouth every 4 (four) hours as needed for pain. 11/22/13  Yes Newman Pies, MD  vitamin B-12 (CYANOCOBALAMIN) 100 MCG tablet Take 100 mcg by mouth daily.   Yes Historical Provider, MD  cephALEXin (KEFLEX) 500 MG capsule Take 1 capsule (500 mg total) by mouth 3 (three) times daily. Patient not taking: Reported on 01/07/2017 11/13/15   Clydell Hakim, MD  diazepam (VALIUM) 5 MG  tablet Take 1 tablet (5 mg total) by mouth every 6 (six) hours as needed for anxiety (spasms). 01/07/17   Ella Golomb, PA-C  oxyCODONE (ROXICODONE) 15 MG immediate release tablet Take 1 tablet (15 mg total) by mouth every 4 (four) hours as needed for pain. 01/07/17   Halen Antenucci, PA-C  oxyCODONE-acetaminophen (PERCOCET) 10-325 MG tablet Take 1 tablet by mouth every 4 (four) hours as needed for pain. Patient not taking: Reported on 01/07/2017 11/13/15   Clydell Hakim, MD    Family History No family history on file.  Social History Social History  Substance Use Topics  . Smoking status: Former Smoker    Packs/day: 0.50    Types: Cigarettes    Quit date: 11/21/1983  . Smokeless tobacco: Never Used  . Alcohol use Yes     Comment: rarely     Allergies   Other   Review of Systems Review of Systems  10 systems reviewed and found to be negative, except as noted in the HPI.  Physical Exam Updated Vital Signs BP 135/78   Pulse 62   Temp 98 F (36.7 C)   Resp 16   Ht 5\' 7"  (1.702 m)   Wt 65.8 kg   SpO2 99%   BMI 22.71 kg/m   Physical Exam  Constitutional: She appears well-developed and well-nourished.  HENT:  Head: Normocephalic.  Eyes: Conjunctivae are normal.  Neck: Normal range of motion.  Cardiovascular: Normal rate, regular rhythm and intact distal pulses.   Pulmonary/Chest: Effort normal.  Abdominal: Soft. There is no tenderness.  Neurological: She is alert.  No point tenderness to percussion of lumbar spinal processes.  No TTP or paraspinal muscular spasm. Strength is 5 out of 5 to bilateral lower extremities at hip and knee; extensor hallucis longus 5 out of 5. Ankle strength 5 out of 5, no clonus, neurovascularly intact. No saddle anaesthesia. Patellar reflexes are 2+ bilaterally.      Psychiatric: She has a normal mood and affect.  Nursing note and vitals reviewed.    ED Treatments / Results  Labs (all labs ordered are listed, but only abnormal results  are displayed) Labs Reviewed - No data to display  EKG  EKG Interpretation None       Radiology No results found.  Procedures Procedures (including critical care time)  Medications Ordered in ED Medications  HYDROmorphone (DILAUDID) injection 1 mg (1 mg Intravenous Given 01/07/17 1230)  LORazepam (ATIVAN) injection 0.5 mg (0.5 mg Intravenous Given 01/07/17 1229)  HYDROmorphone (DILAUDID) injection 1 mg (1 mg Intravenous Given 01/07/17 1553)  methocarbamol (ROBAXIN) tablet 1,000 mg (1,000 mg Oral Given 01/07/17 1554)     Initial Impression / Assessment and Plan / ED Course  I have reviewed the triage vital signs and the nursing notes.  Pertinent labs & imaging results that were available during my care of the patient were reviewed by me and considered in my medical decision making (see chart for details).    Vitals:   01/07/17 1526 01/07/17 1530 01/07/17 1545 01/07/17 1559  BP: 136/75 123/81 135/78   Pulse: 66 63 62   Resp: 16   16  Temp:    98 F (36.7 C)  TempSrc:      SpO2: 98% 99% 99%   Weight:      Height:        Medications  HYDROmorphone (DILAUDID) injection 1 mg (1 mg Intravenous Given 01/07/17 1230)  LORazepam (ATIVAN) injection 0.5 mg (0.5 mg Intravenous Given 01/07/17 1229)  HYDROmorphone (DILAUDID) injection 1 mg (1 mg Intravenous Given 01/07/17 1553)  methocarbamol (ROBAXIN) tablet 1,000 mg (1,000 mg Oral Given 01/07/17 1554)    [name] is 66 y.o. female presenting with Acute severe atraumatic exacerbation of her chronic low back pain. She hasn't had any recent instrumentation, neurologic exam is nonfocal, I doubt this is a cord compression however this patient has had significant difficulty in performing her ADLs over the last few days. Will obtain CT as patient has had a history of remote cancer to evaluate for pathologic fracture or metastases.  She does report improvement with increased pain medication. CT is without acute abnormality. I have advised her  not to combine her medication I have given her with any of her older medications and to follow with her neurologist Dr. Maryjean Ka or her neurosurgeon Dr. Arnoldo Morale.  Evaluation does not show pathology that would require ongoing emergent intervention or inpatient treatment. Pt is hemodynamically stable and mentating appropriately. Discussed findings and plan with patient/guardian, who agrees with care plan. All questions answered. Return precautions discussed and outpatient follow up given.    Final Clinical Impressions(s) / ED Diagnoses   Final diagnoses:  Low back pain, unspecified back pain laterality, unspecified chronicity, with sciatica presence unspecified    New Prescriptions Discharge Medication List as of 01/07/2017  3:48 PM    START taking these medications   Details  oxyCODONE (ROXICODONE) 15 MG immediate release tablet Take 1 tablet (15 mg total) by mouth every 4 (four) hours as needed for pain., Starting Sat 01/07/2017, [employer], PA-C 01/08/17 [id number]    Isla Pence, MD 01/08/17 [id number]    Monico Blitz, PA-C 01/18/17 [id number]    Isla Pence, MD 01/18/17 [phone]

## 2017-01-07 NOTE — ED Notes (Signed)
Pt ao x 4.  Maintaining airway.

## 2017-01-07 NOTE — ED Notes (Signed)
1mg  of dilaudid wasted with Lorita Officer, RN

## 2017-01-07 NOTE — Discharge Instructions (Signed)
Please follow with your primary care doctor in the next 2 days for a check-up. They must obtain records for further management.  ° °Do not hesitate to return to the Emergency Department for any new, worsening or concerning symptoms.  ° °

## 2017-01-07 NOTE — ED Notes (Signed)
1 mg dilaudid wasted with RN Whitney HIcks.

## 2017-01-10 DIAGNOSIS — R030 Elevated blood-pressure reading, without diagnosis of hypertension: Secondary | ICD-10-CM | POA: Diagnosis not present

## 2017-01-10 DIAGNOSIS — S39012A Strain of muscle, fascia and tendon of lower back, initial encounter: Secondary | ICD-10-CM | POA: Diagnosis not present

## 2017-01-10 DIAGNOSIS — M545 Low back pain: Secondary | ICD-10-CM | POA: Diagnosis not present

## 2017-01-10 DIAGNOSIS — Z9689 Presence of other specified functional implants: Secondary | ICD-10-CM | POA: Diagnosis not present

## 2017-01-31 DIAGNOSIS — R339 Retention of urine, unspecified: Secondary | ICD-10-CM | POA: Diagnosis not present

## 2017-01-31 DIAGNOSIS — N359 Urethral stricture, unspecified: Secondary | ICD-10-CM | POA: Diagnosis not present

## 2017-02-23 DIAGNOSIS — Z1231 Encounter for screening mammogram for malignant neoplasm of breast: Secondary | ICD-10-CM | POA: Diagnosis not present

## 2017-03-27 DIAGNOSIS — H40023 Open angle with borderline findings, high risk, bilateral: Secondary | ICD-10-CM | POA: Diagnosis not present

## 2017-03-27 DIAGNOSIS — H2513 Age-related nuclear cataract, bilateral: Secondary | ICD-10-CM | POA: Diagnosis not present

## 2017-03-27 DIAGNOSIS — H52203 Unspecified astigmatism, bilateral: Secondary | ICD-10-CM | POA: Diagnosis not present

## 2017-03-27 DIAGNOSIS — G43109 Migraine with aura, not intractable, without status migrainosus: Secondary | ICD-10-CM | POA: Diagnosis not present

## 2017-03-27 DIAGNOSIS — H524 Presbyopia: Secondary | ICD-10-CM | POA: Diagnosis not present

## 2017-03-27 DIAGNOSIS — H5213 Myopia, bilateral: Secondary | ICD-10-CM | POA: Diagnosis not present

## 2017-05-02 DIAGNOSIS — N359 Urethral stricture, unspecified: Secondary | ICD-10-CM | POA: Diagnosis not present

## 2017-05-22 DIAGNOSIS — M545 Low back pain: Secondary | ICD-10-CM | POA: Diagnosis not present

## 2017-05-22 DIAGNOSIS — R030 Elevated blood-pressure reading, without diagnosis of hypertension: Secondary | ICD-10-CM | POA: Diagnosis not present

## 2017-05-22 DIAGNOSIS — Z9689 Presence of other specified functional implants: Secondary | ICD-10-CM | POA: Diagnosis not present

## 2017-05-22 DIAGNOSIS — S39012A Strain of muscle, fascia and tendon of lower back, initial encounter: Secondary | ICD-10-CM | POA: Diagnosis not present

## 2017-06-14 DIAGNOSIS — Z08 Encounter for follow-up examination after completed treatment for malignant neoplasm: Secondary | ICD-10-CM | POA: Diagnosis not present

## 2017-06-14 DIAGNOSIS — Z85828 Personal history of other malignant neoplasm of skin: Secondary | ICD-10-CM | POA: Diagnosis not present

## 2017-06-14 DIAGNOSIS — L570 Actinic keratosis: Secondary | ICD-10-CM | POA: Diagnosis not present

## 2017-07-31 DIAGNOSIS — H40023 Open angle with borderline findings, high risk, bilateral: Secondary | ICD-10-CM | POA: Diagnosis not present

## 2017-08-16 DIAGNOSIS — R030 Elevated blood-pressure reading, without diagnosis of hypertension: Secondary | ICD-10-CM | POA: Diagnosis not present

## 2017-08-16 DIAGNOSIS — M545 Low back pain: Secondary | ICD-10-CM | POA: Diagnosis not present

## 2017-08-16 DIAGNOSIS — M961 Postlaminectomy syndrome, not elsewhere classified: Secondary | ICD-10-CM | POA: Diagnosis not present

## 2017-08-16 DIAGNOSIS — Z9689 Presence of other specified functional implants: Secondary | ICD-10-CM | POA: Diagnosis not present

## 2017-09-19 IMAGING — RF DG C-ARM 61-120 MIN
1 series · 3 of 3 positions shown · non-contrast
Comparison: 04/14/2015 chest x-ray

CLINICAL DATA: Spinal stimulator insertion.

EXAM:
THORACOLUMBAR SPINE 1V

[Series 1: run · 3 of 3 slices shown]
[im 1/3]
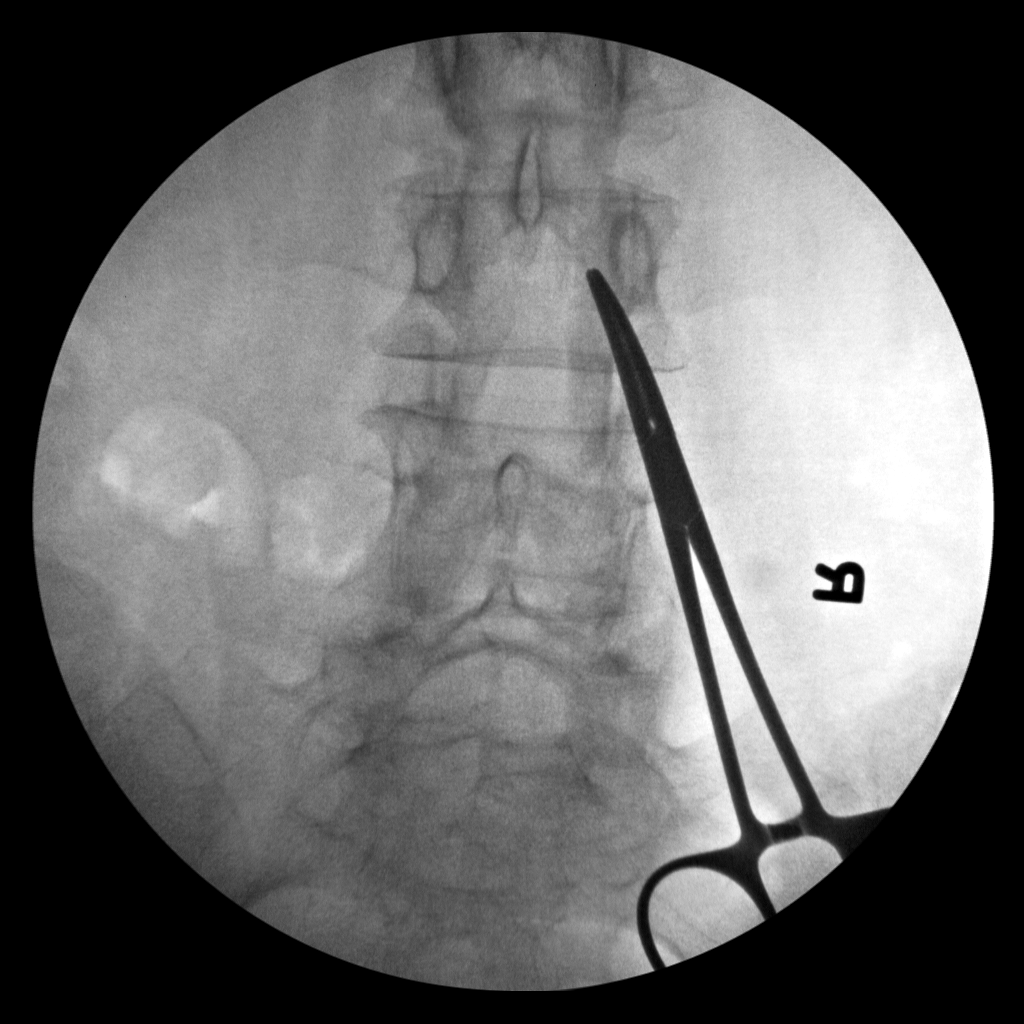
[im 2/3]
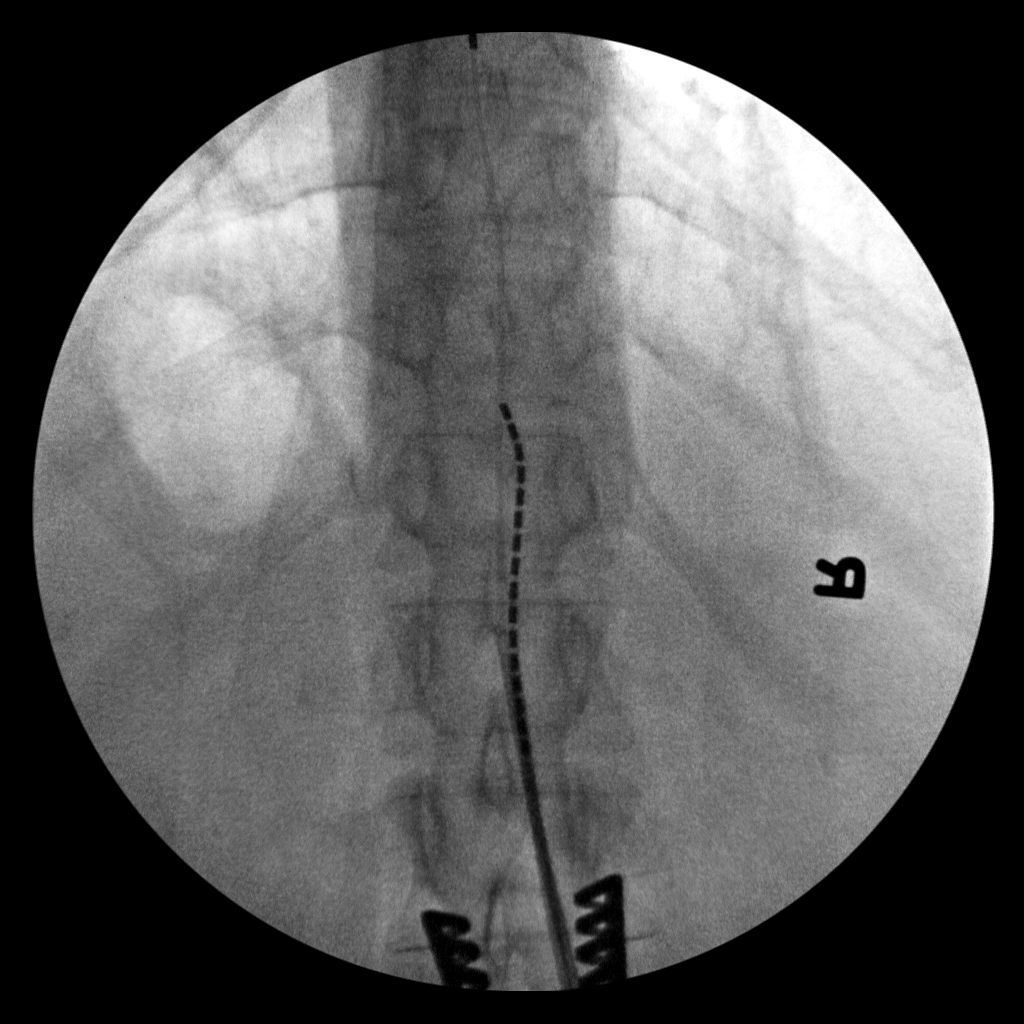
[im 3/3]
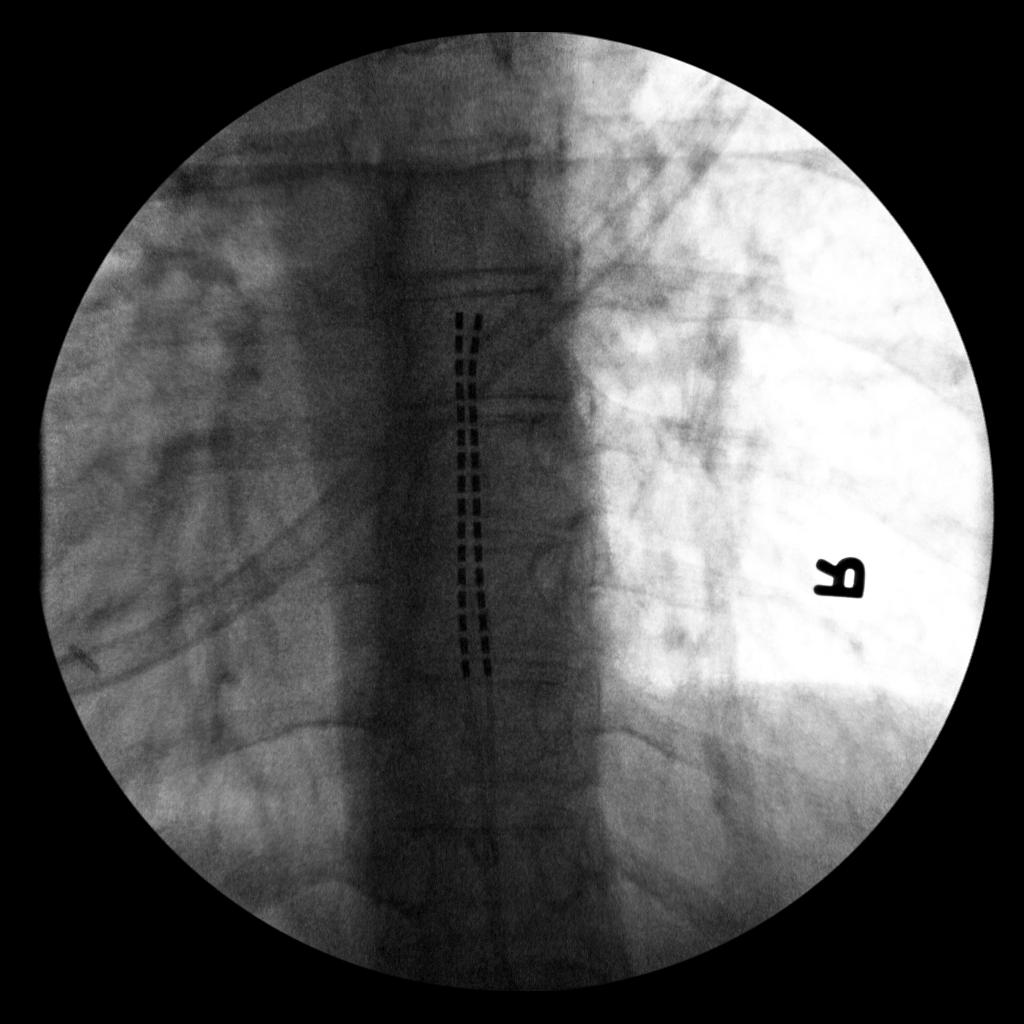

[3 of 3 positions shown; findings below may reference images not displayed]

FINDINGS: AP fluoroscopic images of the thoracolumbar spine show lumbar
laminectomy defect and insertion of dorsal column stimulators which
overlap the midline thoracic spine. No acute or unexpected osseous
finding.
IMPRESSION: Fluoroscopy for dorsal column stimulator placement.

## 2017-10-02 DIAGNOSIS — Z1211 Encounter for screening for malignant neoplasm of colon: Secondary | ICD-10-CM | POA: Diagnosis not present

## 2017-10-02 DIAGNOSIS — Z800 Family history of malignant neoplasm of digestive organs: Secondary | ICD-10-CM | POA: Diagnosis not present

## 2017-10-02 DIAGNOSIS — K640 First degree hemorrhoids: Secondary | ICD-10-CM | POA: Diagnosis not present

## 2017-10-02 DIAGNOSIS — D120 Benign neoplasm of cecum: Secondary | ICD-10-CM | POA: Diagnosis not present

## 2017-11-02 DIAGNOSIS — R3129 Other microscopic hematuria: Secondary | ICD-10-CM | POA: Diagnosis not present

## 2017-11-02 DIAGNOSIS — R823 Hemoglobinuria: Secondary | ICD-10-CM | POA: Diagnosis not present

## 2017-11-02 DIAGNOSIS — R339 Retention of urine, unspecified: Secondary | ICD-10-CM | POA: Diagnosis not present

## 2017-11-08 DIAGNOSIS — M545 Low back pain: Secondary | ICD-10-CM | POA: Diagnosis not present

## 2017-11-08 DIAGNOSIS — M961 Postlaminectomy syndrome, not elsewhere classified: Secondary | ICD-10-CM | POA: Diagnosis not present

## 2017-11-08 DIAGNOSIS — Z9689 Presence of other specified functional implants: Secondary | ICD-10-CM | POA: Diagnosis not present

## 2017-11-08 DIAGNOSIS — R030 Elevated blood-pressure reading, without diagnosis of hypertension: Secondary | ICD-10-CM | POA: Diagnosis not present

## 2017-12-06 ENCOUNTER — Telehealth (INDEPENDENT_AMBULATORY_CARE_PROVIDER_SITE_OTHER): Payer: Self-pay

## 2017-12-06 NOTE — Telephone Encounter (Signed)
Dentist office calling to question if the pt has been released from needing premed prior to any dental procedures.

## 2017-12-07 NOTE — Telephone Encounter (Signed)
Pt had R THA  On 05-22-2002. Dentist calling to see if pt needs antibiotics.

## 2017-12-07 NOTE — Telephone Encounter (Signed)
No need for antibiotics.

## 2017-12-08 NOTE — Telephone Encounter (Signed)
Done

## 2017-12-18 DIAGNOSIS — Z85828 Personal history of other malignant neoplasm of skin: Secondary | ICD-10-CM | POA: Diagnosis not present

## 2017-12-18 DIAGNOSIS — Z08 Encounter for follow-up examination after completed treatment for malignant neoplasm: Secondary | ICD-10-CM | POA: Diagnosis not present

## 2017-12-18 DIAGNOSIS — L814 Other melanin hyperpigmentation: Secondary | ICD-10-CM | POA: Diagnosis not present

## 2017-12-18 DIAGNOSIS — L821 Other seborrheic keratosis: Secondary | ICD-10-CM | POA: Diagnosis not present

## 2017-12-18 DIAGNOSIS — L570 Actinic keratosis: Secondary | ICD-10-CM | POA: Diagnosis not present

## 2017-12-18 DIAGNOSIS — D1801 Hemangioma of skin and subcutaneous tissue: Secondary | ICD-10-CM | POA: Diagnosis not present

## 2017-12-19 DIAGNOSIS — N3592 Unspecified urethral stricture, female: Secondary | ICD-10-CM | POA: Diagnosis not present

## 2018-01-30 DIAGNOSIS — M545 Low back pain: Secondary | ICD-10-CM | POA: Diagnosis not present

## 2018-01-30 DIAGNOSIS — M961 Postlaminectomy syndrome, not elsewhere classified: Secondary | ICD-10-CM | POA: Diagnosis not present

## 2018-01-30 DIAGNOSIS — S39012A Strain of muscle, fascia and tendon of lower back, initial encounter: Secondary | ICD-10-CM | POA: Diagnosis not present

## 2018-02-01 DIAGNOSIS — N3592 Unspecified urethral stricture, female: Secondary | ICD-10-CM | POA: Diagnosis not present

## 2018-02-01 DIAGNOSIS — R339 Retention of urine, unspecified: Secondary | ICD-10-CM | POA: Diagnosis not present

## 2018-02-05 DIAGNOSIS — H43393 Other vitreous opacities, bilateral: Secondary | ICD-10-CM | POA: Diagnosis not present

## 2018-02-05 DIAGNOSIS — H524 Presbyopia: Secondary | ICD-10-CM | POA: Diagnosis not present

## 2018-02-05 DIAGNOSIS — H40023 Open angle with borderline findings, high risk, bilateral: Secondary | ICD-10-CM | POA: Diagnosis not present

## 2018-02-05 DIAGNOSIS — H5213 Myopia, bilateral: Secondary | ICD-10-CM | POA: Diagnosis not present

## 2018-02-05 DIAGNOSIS — H2513 Age-related nuclear cataract, bilateral: Secondary | ICD-10-CM | POA: Diagnosis not present

## 2018-02-05 DIAGNOSIS — H52203 Unspecified astigmatism, bilateral: Secondary | ICD-10-CM | POA: Diagnosis not present

## 2018-02-05 DIAGNOSIS — H04123 Dry eye syndrome of bilateral lacrimal glands: Secondary | ICD-10-CM | POA: Diagnosis not present

## 2018-04-19 DIAGNOSIS — M545 Low back pain: Secondary | ICD-10-CM | POA: Diagnosis not present

## 2018-04-19 DIAGNOSIS — M961 Postlaminectomy syndrome, not elsewhere classified: Secondary | ICD-10-CM | POA: Diagnosis not present

## 2018-06-20 DIAGNOSIS — Z85828 Personal history of other malignant neoplasm of skin: Secondary | ICD-10-CM | POA: Diagnosis not present

## 2018-06-20 DIAGNOSIS — L821 Other seborrheic keratosis: Secondary | ICD-10-CM | POA: Diagnosis not present

## 2018-06-20 DIAGNOSIS — Z08 Encounter for follow-up examination after completed treatment for malignant neoplasm: Secondary | ICD-10-CM | POA: Diagnosis not present

## 2018-06-20 DIAGNOSIS — L570 Actinic keratosis: Secondary | ICD-10-CM | POA: Diagnosis not present

## 2018-07-16 ENCOUNTER — Other Ambulatory Visit: Payer: Self-pay | Admitting: Neurosurgery

## 2018-07-16 DIAGNOSIS — M961 Postlaminectomy syndrome, not elsewhere classified: Secondary | ICD-10-CM | POA: Diagnosis not present

## 2018-07-16 DIAGNOSIS — R030 Elevated blood-pressure reading, without diagnosis of hypertension: Secondary | ICD-10-CM | POA: Diagnosis not present

## 2018-07-16 DIAGNOSIS — S39012A Strain of muscle, fascia and tendon of lower back, initial encounter: Secondary | ICD-10-CM | POA: Diagnosis not present

## 2018-07-16 DIAGNOSIS — M545 Low back pain: Secondary | ICD-10-CM | POA: Diagnosis not present

## 2018-07-19 ENCOUNTER — Ambulatory Visit
Admission: RE | Admit: 2018-07-19 | Discharge: 2018-07-19 | Disposition: A | Discharge status: Home / Self Care | Payer: Medicare Other | Source: Ambulatory Visit | Attending: Neurosurgery | Admitting: Neurosurgery

## 2018-07-19 VITALS — BP 128/63 | HR 63

## 2018-07-19 DIAGNOSIS — M7138 Other bursal cyst, other site: Secondary | ICD-10-CM

## 2018-07-19 DIAGNOSIS — M5126 Other intervertebral disc displacement, lumbar region: Secondary | ICD-10-CM | POA: Diagnosis not present

## 2018-07-19 DIAGNOSIS — M961 Postlaminectomy syndrome, not elsewhere classified: Secondary | ICD-10-CM

## 2018-07-19 MED ORDER — DIAZEPAM 5 MG PO TABS
5.0000 mg | ORAL_TABLET | Freq: Once | ORAL | Status: AC
  Administered 2018-07-19: 5 mg via ORAL

## 2018-07-19 MED ORDER — IOPAMIDOL (ISOVUE-M 200) INJECTION 41%
20.0000 mL | Freq: Once | INTRAMUSCULAR | Status: AC
  Administered 2018-07-19: 20 mL via INTRATHECAL

## 2018-07-19 NOTE — Discharge Instructions (Signed)

## 2018-07-27 ENCOUNTER — Other Ambulatory Visit: Payer: BC Managed Care – PPO

## 2018-08-02 DIAGNOSIS — R300 Dysuria: Secondary | ICD-10-CM | POA: Diagnosis not present

## 2018-08-02 DIAGNOSIS — N3592 Unspecified urethral stricture, female: Secondary | ICD-10-CM | POA: Diagnosis not present

## 2018-08-02 DIAGNOSIS — R339 Retention of urine, unspecified: Secondary | ICD-10-CM | POA: Diagnosis not present

## 2018-09-04 DIAGNOSIS — G8929 Other chronic pain: Secondary | ICD-10-CM | POA: Diagnosis not present

## 2018-09-04 DIAGNOSIS — C50512 Malignant neoplasm of lower-outer quadrant of left female breast: Secondary | ICD-10-CM | POA: Diagnosis not present

## 2018-09-04 DIAGNOSIS — Z13220 Encounter for screening for lipoid disorders: Secondary | ICD-10-CM | POA: Diagnosis not present

## 2018-09-04 DIAGNOSIS — M5442 Lumbago with sciatica, left side: Secondary | ICD-10-CM | POA: Diagnosis not present

## 2018-09-04 DIAGNOSIS — E785 Hyperlipidemia, unspecified: Secondary | ICD-10-CM | POA: Diagnosis not present

## 2018-09-04 DIAGNOSIS — Z9689 Presence of other specified functional implants: Secondary | ICD-10-CM | POA: Diagnosis not present

## 2018-09-04 DIAGNOSIS — Z170 Estrogen receptor positive status [ER+]: Secondary | ICD-10-CM | POA: Diagnosis not present

## 2018-09-04 DIAGNOSIS — F5104 Psychophysiologic insomnia: Secondary | ICD-10-CM | POA: Diagnosis not present

## 2018-09-04 DIAGNOSIS — Z23 Encounter for immunization: Secondary | ICD-10-CM | POA: Diagnosis not present

## 2018-09-04 DIAGNOSIS — Z1159 Encounter for screening for other viral diseases: Secondary | ICD-10-CM | POA: Diagnosis not present

## 2018-09-22 DIAGNOSIS — F5104 Psychophysiologic insomnia: Secondary | ICD-10-CM | POA: Diagnosis not present

## 2018-09-22 DIAGNOSIS — Z13220 Encounter for screening for lipoid disorders: Secondary | ICD-10-CM | POA: Diagnosis not present

## 2018-09-22 DIAGNOSIS — Z170 Estrogen receptor positive status [ER+]: Secondary | ICD-10-CM | POA: Diagnosis not present

## 2018-09-22 DIAGNOSIS — C50512 Malignant neoplasm of lower-outer quadrant of left female breast: Secondary | ICD-10-CM | POA: Diagnosis not present

## 2018-09-22 DIAGNOSIS — Z1159 Encounter for screening for other viral diseases: Secondary | ICD-10-CM | POA: Diagnosis not present

## 2018-09-22 DIAGNOSIS — E785 Hyperlipidemia, unspecified: Secondary | ICD-10-CM | POA: Diagnosis not present

## 2018-09-22 DIAGNOSIS — Z79899 Other long term (current) drug therapy: Secondary | ICD-10-CM | POA: Diagnosis not present

## 2018-09-28 DIAGNOSIS — R300 Dysuria: Secondary | ICD-10-CM | POA: Diagnosis not present

## 2018-09-28 DIAGNOSIS — N3592 Unspecified urethral stricture, female: Secondary | ICD-10-CM | POA: Diagnosis not present

## 2018-10-15 DIAGNOSIS — M961 Postlaminectomy syndrome, not elsewhere classified: Secondary | ICD-10-CM | POA: Diagnosis not present

## 2018-10-15 DIAGNOSIS — Z79891 Long term (current) use of opiate analgesic: Secondary | ICD-10-CM | POA: Diagnosis not present

## 2018-10-15 DIAGNOSIS — R030 Elevated blood-pressure reading, without diagnosis of hypertension: Secondary | ICD-10-CM | POA: Diagnosis not present

## 2018-11-15 ENCOUNTER — Encounter (INDEPENDENT_AMBULATORY_CARE_PROVIDER_SITE_OTHER): Payer: Self-pay | Admitting: Orthopaedic Surgery

## 2018-11-15 ENCOUNTER — Ambulatory Visit (INDEPENDENT_AMBULATORY_CARE_PROVIDER_SITE_OTHER): Payer: Medicare Other | Admitting: Orthopaedic Surgery

## 2018-11-15 ENCOUNTER — Ambulatory Visit (INDEPENDENT_AMBULATORY_CARE_PROVIDER_SITE_OTHER): Payer: Medicare Other

## 2018-11-15 VITALS — BP 138/79 | HR 73 | Ht 67.0 in | Wt 150.0 lb

## 2018-11-15 DIAGNOSIS — M25551 Pain in right hip: Secondary | ICD-10-CM

## 2018-11-15 NOTE — Progress Notes (Signed)
Office Visit Note   Patient: [name]           Date of Birth: Apr 23, 1951           MRN: [id number] Visit Date: 11/15/2018              Requested by: Daylene Posey, [address] [place], [place] [address] PCP: Daylene Posey, FNP   Assessment & Plan: Visit Diagnoses:  1. Pain of right hip joint     Plan: [name] has been seen on multiple occasions over the years evaluation of low back pain and right hip pain.  She is about 15 years status post primary right total hip replacement and actually is done well pedis.  Recently she had an acute exacerbation of back pain was seen in the emergency room Dilaudid and a muscle relaxant.  She does have a spinal stimulator.  Think some pain along the lateral aspect of her right hip concerned that there may be a problem with her hip replacement.  She is not had any groin or thigh pain.  No fever no numbness or tingling.  Not sure if her present problem is related to her back or to her hip.  She is only had trouble now for several weeks and I would suggest giving it another 3 to 4 weeks and see how she progresses.  If she continues to have pain then I think it is worth obtaining a three-phase bone scan of her right hip or considering a diagnostic aspiration and cortisone injection with Dr. Ernestina Patches  Follow-Up Instructions: Return if symptoms worsen or fail to improve.   Orders:  Orders Placed This Encounter  Procedures  . XR HIP UNILAT W OR W/O PELVIS 2-3 VIEWS RIGHT   No orders of the defined types were placed in this encounter.     Procedures: No procedures performed   Clinical Data: No additional findings.   Subjective: Chief Complaint  Patient presents with  . Right Hip - Follow-up    Hurts ALL the time, wants to discuss a revision.   [name] had a recent exacerbation of her low back pain was seen in the emergency room treated with Dilaudid.  She notes a lot of that discomfort has resolved.  She presently is  experiencing pain along the lateral aspect of her right hip that is "deep".  No groin or thigh pain.  No fever or chills.  Has some pain when she is ambulating and even some trouble when she is sleeping.  It is difficult for her to tell whether this is related to her hip or to her back.  No numbness or tingling.  She has chronic problem with her back with prior surgery and insertion of a spinal stimulator  HPI  Review of Systems   Objective: Vital Signs: BP 138/79 (BP Location: Right Arm, Patient Position: Sitting)   Pulse 73   Ht 5\' 7"  (1.702 m)   Wt 150 lb (68 kg)   BMI 23.49 kg/m   Physical Exam Constitutional:      Appearance: She is well-developed.  Eyes:     Pupils: Pupils are equal, round, and reactive to light.  Pulmonary:     Effort: Pulmonary effort is normal.  Skin:    General: Skin is warm and dry.  Neurological:     Mental Status: She is alert and oriented to person, place, and time.  Psychiatric:        Behavior: Behavior normal.  Ortho Exam awake alert and oriented x3.  Comfortable sitting.  No pain with range of motion of right hip with internal and external rotation on any extreme of motion.  No thigh pain.  No localized tenderness about the greater trochanter or about the lateral aspect of her hip.  Her pain is "deep" and only apparent with sitting or with weightbearing no percussible tenderness of the lumbar spine  Specialty Comments:  No specialty comments available.  Imaging: Xr Hip Unilat W Or W/o Pelvis 2-3 Views Right  Result Date: 11/15/2018 AP pelvis and lateral of the right hip are obtained.  There might be slight polyethylene wear with a slightly superior position of the metallic femoral head.  I measured about a 2 mm difference.  No other changes.  There is some stress shielding beneath the femoral collar which is unchanged    PMFS History: Patient Active Problem List   Diagnosis Date Noted  . Synovial cyst of lumbar facet joint  11/21/2013   Past Medical History:  Diagnosis Date  . Arthritis   . Cancer ([place])    left breast  . Chronic kidney disease    cyst right kidney  . Complication of anesthesia   . Head ache    transient global amnesia after migraines 2013  . Headache(784.0)    migraines hx  . PONV (postoperative nausea and vomiting)   . Scoliosis     History reviewed. No pertinent family history.  Past Surgical History:  Procedure Laterality Date  . BACK SURGERY  2010   microdectomy Dr. Arnoldo Morale  . BREAST SURGERY Left 1999   Dr Irish Elders  . infart fallopian tube  1989    Dr Hoffman/ infarction  . JOINT REPLACEMENT Right 2003    Dr. Durward Fortes rt hip congenital defect  . LUMBAR LAMINECTOMY/DECOMPRESSION MICRODISCECTOMY Bilateral 11/21/2013   Procedure: Bilateral Lumbar three-four laminectomy for synovial cyst;  Surgeon: Ophelia Charter, MD;  Location: [facility];  Service: Neurosurgery;  Laterality: Bilateral;  Bilateral Lumbar three-four laminectomy for synovial cyst  . SPINAL CORD STIMULATOR INSERTION N/A 11/13/2015   Procedure: LUMBAR SPINAL CORD STIMULATOR INSERTION-PERMANENT;  Surgeon: Clydell Hakim, MD;  Location: [place] [facility];  Service: Neurosurgery;  Laterality: N/A;  LUMBAR SPINAL CORD STIMULATOR INSERTION-PERMANENT  . sweat gland Right 1980   excision tumor from sweat gland  . TONSILLECTOMY  1959  . TUBAL LIGATION Bilateral 1988   Dr Heber [place]  . TUBAL LIGATION     Social History   Occupational History  . Not on file  Tobacco Use  . Smoking status: Former Smoker    Packs/day: 0.50    Types: Cigarettes    Last attempt to quit: 11/21/1983    Years since quitting: 35.0  . Smokeless tobacco: Never Used  Substance and Sexual Activity  . Alcohol use: Yes    Comment: rarely  . Drug use: No  . Sexual activity: Not on file     Garald Balding, MD   Note - This record has been created using [employer].  Chart creation errors have been sought, but may not always  have been  located. Such creation errors do not reflect on  the standard of medical care.

## 2018-12-11 DIAGNOSIS — Z1231 Encounter for screening mammogram for malignant neoplasm of breast: Secondary | ICD-10-CM | POA: Diagnosis not present

## 2018-12-19 DIAGNOSIS — Z85828 Personal history of other malignant neoplasm of skin: Secondary | ICD-10-CM | POA: Diagnosis not present

## 2018-12-19 DIAGNOSIS — Z08 Encounter for follow-up examination after completed treatment for malignant neoplasm: Secondary | ICD-10-CM | POA: Diagnosis not present

## 2018-12-19 DIAGNOSIS — L84 Corns and callosities: Secondary | ICD-10-CM | POA: Diagnosis not present

## 2018-12-19 DIAGNOSIS — L570 Actinic keratosis: Secondary | ICD-10-CM | POA: Diagnosis not present

## 2019-01-04 DIAGNOSIS — N3592 Unspecified urethral stricture, female: Secondary | ICD-10-CM | POA: Diagnosis not present

## 2019-01-08 DIAGNOSIS — Z9689 Presence of other specified functional implants: Secondary | ICD-10-CM | POA: Diagnosis not present

## 2019-01-08 DIAGNOSIS — R030 Elevated blood-pressure reading, without diagnosis of hypertension: Secondary | ICD-10-CM | POA: Diagnosis not present

## 2019-01-08 DIAGNOSIS — M542 Cervicalgia: Secondary | ICD-10-CM | POA: Diagnosis not present

## 2019-01-08 DIAGNOSIS — M961 Postlaminectomy syndrome, not elsewhere classified: Secondary | ICD-10-CM | POA: Diagnosis not present

## 2019-01-09 DIAGNOSIS — E538 Deficiency of other specified B group vitamins: Secondary | ICD-10-CM | POA: Diagnosis not present

## 2019-01-09 DIAGNOSIS — Z780 Asymptomatic menopausal state: Secondary | ICD-10-CM | POA: Diagnosis not present

## 2019-01-09 DIAGNOSIS — F5104 Psychophysiologic insomnia: Secondary | ICD-10-CM | POA: Diagnosis not present

## 2019-01-09 DIAGNOSIS — D72819 Decreased white blood cell count, unspecified: Secondary | ICD-10-CM | POA: Diagnosis not present

## 2019-01-18 DIAGNOSIS — M8589 Other specified disorders of bone density and structure, multiple sites: Secondary | ICD-10-CM | POA: Diagnosis not present

## 2019-01-18 DIAGNOSIS — M810 Age-related osteoporosis without current pathological fracture: Secondary | ICD-10-CM | POA: Diagnosis not present

## 2019-01-18 DIAGNOSIS — Z780 Asymptomatic menopausal state: Secondary | ICD-10-CM | POA: Diagnosis not present

## 2019-02-05 DIAGNOSIS — M816 Localized osteoporosis [Lequesne]: Secondary | ICD-10-CM | POA: Diagnosis not present

## 2019-02-11 DIAGNOSIS — H2513 Age-related nuclear cataract, bilateral: Secondary | ICD-10-CM | POA: Diagnosis not present

## 2019-02-11 DIAGNOSIS — H52203 Unspecified astigmatism, bilateral: Secondary | ICD-10-CM | POA: Diagnosis not present

## 2019-02-11 DIAGNOSIS — H04123 Dry eye syndrome of bilateral lacrimal glands: Secondary | ICD-10-CM | POA: Diagnosis not present

## 2019-02-11 DIAGNOSIS — H40023 Open angle with borderline findings, high risk, bilateral: Secondary | ICD-10-CM | POA: Diagnosis not present

## 2019-02-11 DIAGNOSIS — H43393 Other vitreous opacities, bilateral: Secondary | ICD-10-CM | POA: Diagnosis not present

## 2019-02-11 DIAGNOSIS — H5213 Myopia, bilateral: Secondary | ICD-10-CM | POA: Diagnosis not present

## 2019-02-11 DIAGNOSIS — H524 Presbyopia: Secondary | ICD-10-CM | POA: Diagnosis not present

## 2019-02-20 DIAGNOSIS — R030 Elevated blood-pressure reading, without diagnosis of hypertension: Secondary | ICD-10-CM | POA: Diagnosis not present

## 2019-02-20 DIAGNOSIS — M542 Cervicalgia: Secondary | ICD-10-CM | POA: Diagnosis not present

## 2019-02-20 DIAGNOSIS — M545 Low back pain: Secondary | ICD-10-CM | POA: Diagnosis not present

## 2019-02-20 DIAGNOSIS — Z9689 Presence of other specified functional implants: Secondary | ICD-10-CM | POA: Diagnosis not present

## 2019-03-11 DIAGNOSIS — E538 Deficiency of other specified B group vitamins: Secondary | ICD-10-CM | POA: Diagnosis not present

## 2019-03-11 DIAGNOSIS — M810 Age-related osteoporosis without current pathological fracture: Secondary | ICD-10-CM | POA: Diagnosis not present

## 2019-03-11 DIAGNOSIS — Z780 Asymptomatic menopausal state: Secondary | ICD-10-CM | POA: Diagnosis not present

## 2019-03-11 DIAGNOSIS — C50512 Malignant neoplasm of lower-outer quadrant of left female breast: Secondary | ICD-10-CM | POA: Diagnosis not present

## 2019-04-09 DIAGNOSIS — R339 Retention of urine, unspecified: Secondary | ICD-10-CM | POA: Diagnosis not present

## 2019-04-09 DIAGNOSIS — N3592 Unspecified urethral stricture, female: Secondary | ICD-10-CM | POA: Diagnosis not present

## 2019-05-13 DIAGNOSIS — M961 Postlaminectomy syndrome, not elsewhere classified: Secondary | ICD-10-CM | POA: Diagnosis not present

## 2019-05-13 DIAGNOSIS — M5417 Radiculopathy, lumbosacral region: Secondary | ICD-10-CM | POA: Diagnosis not present

## 2019-07-03 DIAGNOSIS — Z85828 Personal history of other malignant neoplasm of skin: Secondary | ICD-10-CM | POA: Diagnosis not present

## 2019-07-03 DIAGNOSIS — L570 Actinic keratosis: Secondary | ICD-10-CM | POA: Diagnosis not present

## 2019-07-03 DIAGNOSIS — L821 Other seborrheic keratosis: Secondary | ICD-10-CM | POA: Diagnosis not present

## 2019-07-11 DIAGNOSIS — M816 Localized osteoporosis [Lequesne]: Secondary | ICD-10-CM | POA: Diagnosis not present

## 2019-07-11 DIAGNOSIS — E785 Hyperlipidemia, unspecified: Secondary | ICD-10-CM | POA: Diagnosis not present

## 2019-07-11 DIAGNOSIS — E538 Deficiency of other specified B group vitamins: Secondary | ICD-10-CM | POA: Diagnosis not present

## 2019-07-11 DIAGNOSIS — R3129 Other microscopic hematuria: Secondary | ICD-10-CM | POA: Diagnosis not present

## 2019-08-15 DIAGNOSIS — M961 Postlaminectomy syndrome, not elsewhere classified: Secondary | ICD-10-CM | POA: Diagnosis not present

## 2019-08-15 DIAGNOSIS — Z9689 Presence of other specified functional implants: Secondary | ICD-10-CM | POA: Diagnosis not present

## 2019-08-15 DIAGNOSIS — Z5181 Encounter for therapeutic drug level monitoring: Secondary | ICD-10-CM | POA: Diagnosis not present

## 2019-09-10 DIAGNOSIS — C50512 Malignant neoplasm of lower-outer quadrant of left female breast: Secondary | ICD-10-CM | POA: Diagnosis not present

## 2019-09-10 DIAGNOSIS — Z780 Asymptomatic menopausal state: Secondary | ICD-10-CM | POA: Diagnosis not present

## 2019-09-10 DIAGNOSIS — E538 Deficiency of other specified B group vitamins: Secondary | ICD-10-CM | POA: Diagnosis not present

## 2019-09-10 DIAGNOSIS — E559 Vitamin D deficiency, unspecified: Secondary | ICD-10-CM | POA: Diagnosis not present

## 2019-09-10 DIAGNOSIS — M810 Age-related osteoporosis without current pathological fracture: Secondary | ICD-10-CM | POA: Diagnosis not present

## 2019-09-10 DIAGNOSIS — D519 Vitamin B12 deficiency anemia, unspecified: Secondary | ICD-10-CM | POA: Diagnosis not present

## 2019-09-10 DIAGNOSIS — R5383 Other fatigue: Secondary | ICD-10-CM | POA: Diagnosis not present

## 2019-09-17 ENCOUNTER — Other Ambulatory Visit: Payer: Self-pay

## 2019-09-17 ENCOUNTER — Encounter: Payer: Self-pay | Admitting: Orthopaedic Surgery

## 2019-09-17 ENCOUNTER — Ambulatory Visit (INDEPENDENT_AMBULATORY_CARE_PROVIDER_SITE_OTHER): Payer: Medicare Other | Admitting: Orthopaedic Surgery

## 2019-09-17 ENCOUNTER — Ambulatory Visit (INDEPENDENT_AMBULATORY_CARE_PROVIDER_SITE_OTHER): Payer: Medicare Other

## 2019-09-17 VITALS — BP 145/60 | HR 71 | Ht 67.0 in | Wt 150.0 lb

## 2019-09-17 DIAGNOSIS — G8929 Other chronic pain: Secondary | ICD-10-CM

## 2019-09-17 DIAGNOSIS — M25512 Pain in left shoulder: Secondary | ICD-10-CM

## 2019-09-17 MED ORDER — METHYLPREDNISOLONE ACETATE 40 MG/ML IJ SUSP
80.0000 mg | INTRAMUSCULAR | Status: AC | PRN
  Administered 2019-09-17: 12:00:00 80 mg via INTRA_ARTICULAR

## 2019-09-17 MED ORDER — LIDOCAINE HCL 2 % IJ SOLN
2.0000 mL | INTRAMUSCULAR | Status: AC | PRN
  Administered 2019-09-17: 2 mL

## 2019-09-17 MED ORDER — BUPIVACAINE HCL 0.5 % IJ SOLN
2.0000 mL | INTRAMUSCULAR | Status: AC | PRN
  Administered 2019-09-17: 2 mL via INTRA_ARTICULAR

## 2019-09-17 NOTE — Progress Notes (Signed)
Office Visit Note   Patient: [name]           Date of Birth: [birth year]           MRN: [id number] Visit Date: 09/17/2019              Requested by: Daylene Posey, [id number] [place],  [place] [address] PCP: Daylene Posey, FNP   Assessment & Plan: Visit Diagnoses:  1. Chronic left shoulder pain     Plan: Impingement syndrome left shoulder.  Will inject the subacromial space with cortisone and monitor response.  If no improvement or persistent pain over the next 3 to 4 weeks would consider MRI scan to rule out rotator cuff tear  Follow-Up Instructions: Return if symptoms worsen or fail to improve.   Orders:  Orders Placed This Encounter  Procedures  . Large Joint Inj: L subacromial bursa  . XR Shoulder Left   No orders of the defined types were placed in this encounter.     Procedures: Large Joint Inj: L subacromial bursa on 09/17/2019 12:15 PM Indications: pain and diagnostic evaluation Details: 25 G 1.5 in needle, anterolateral approach  Arthrogram: No  Medications: 2 mL lidocaine 2 %; 2 mL bupivacaine 0.5 %; 80 mg methylPREDNISolone acetate 40 MG/ML Consent was given by the patient. Immediately prior to procedure a time out was called to verify the correct patient, procedure, equipment, support staff and site/side marked as required. Patient was prepped and draped in the usual sterile fashion.       Clinical Data: No additional findings.   Subjective: Chief Complaint  Patient presents with  . Left Shoulder - Pain  Patient presents today for left shoulder pain X 2 weeks. No known injury. Her pain is located superiorly and radiates down her arm. No weakness. She has limited range of motion. She is right hand dominant. No previous surgery. She takes Percocet for back pain. She has tried Diclofenac cream, but that has not helped.   HPI  Review of Systems  Constitutional: Negative for fatigue.  HENT: Negative for ear pain.    Eyes: Negative for pain.  Respiratory: Negative for shortness of breath.   Cardiovascular: Negative for leg swelling.  Gastrointestinal: Negative for constipation and diarrhea.  Endocrine: Negative for cold intolerance and heat intolerance.  Genitourinary: Negative for difficulty urinating.  Musculoskeletal: Negative for joint swelling.  Skin: Negative for rash.  Allergic/Immunologic: Negative for food allergies.  Neurological: Negative for weakness.  Hematological: Does not bruise/bleed easily.  Psychiatric/Behavioral: Negative for sleep disturbance.     Objective: Vital Signs: BP (!) 145/60   Pulse 71   Ht 5\' 7"  (1.702 m)   Wt 150 lb (68 kg)   BMI 23.49 kg/m   Physical Exam Constitutional:      Appearance: She is well-developed.  Eyes:     Pupils: Pupils are equal, round, and reactive to light.  Pulmonary:     Effort: Pulmonary effort is normal.  Skin:    General: Skin is warm and dry.  Neurological:     Mental Status: She is alert and oriented to person, place, and time.  Psychiatric:        Behavior: Behavior normal.     Ortho Exam left shoulder with positive impingement on the extremes of internal and external rotation.  Positive empty can testing.  Biceps intact.  Mild anterior subacromial pain.  No pain at the [facility] joint.  Good strength.  Skin intact.  Good grip and good release.  No neck pain  Specialty Comments:  No specialty comments available.  Imaging: Xr Shoulder Left  Result Date: 09/17/2019 Films of the left shoulder obtained in several projections.  The humeral head is centered about the glenoid.  Normal space between the humeral head and the acromion.  There is a downsloping lateral acromion.  No ectopic calcification.  Mild degenerative changes at the [facility] joint.    PMFS History: Patient Active Problem List   Diagnosis Date Noted  . Pain in left shoulder 09/17/2019  . Synovial cyst of lumbar facet joint 11/21/2013   Past Medical History:   Diagnosis Date  . Arthritis   . Cancer ([place])    left breast  . Chronic kidney disease    cyst right kidney  . Complication of anesthesia   . Head ache    transient global amnesia after migraines 2013  . Headache(784.0)    migraines hx  . PONV (postoperative nausea and vomiting)   . Scoliosis     History reviewed. No pertinent family history.  Past Surgical History:  Procedure Laterality Date  . BACK SURGERY  2010   microdectomy Dr. Arnoldo Morale  . BREAST SURGERY Left 1999   Dr Irish Elders  . infart fallopian tube  1989    Dr Hoffman/ infarction  . JOINT REPLACEMENT Right 2003    Dr. Durward Fortes rt hip congenital defect  . LUMBAR LAMINECTOMY/DECOMPRESSION MICRODISCECTOMY Bilateral 11/21/2013   Procedure: Bilateral Lumbar three-four laminectomy for synovial cyst;  Surgeon: Ophelia Charter, MD;  Location: [place] [facility];  Service: Neurosurgery;  Laterality: Bilateral;  Bilateral Lumbar three-four laminectomy for synovial cyst  . SPINAL CORD STIMULATOR INSERTION N/A 11/13/2015   Procedure: LUMBAR SPINAL CORD STIMULATOR INSERTION-PERMANENT;  Surgeon: Clydell Hakim, MD;  Location: [facility] [facility]UR[facility] OR[facility];  Service: Neurosurgery;  Laterality: N/A;  LUMBAR SPINAL CORD STIMULATOR INSERTION-PERMANENT  . sweat gland Right 1980   excision tumor from sweat gland  . TONSILLECTOMY  1959  . TUBAL LIGATION Bilateral 1988   Dr Heber [place]  . TUBAL LIGATION     Social History   Occupational History  . Not on file  Tobacco Use  . Smoking status: Former Smoker    Packs/day: 0.50    Types: Cigarettes    Quit date: 11/21/1983    Years since quitting: 35.8  . Smokeless tobacco: Never Used  Substance and Sexual Activity  . Alcohol use: Yes    Comment: rarely  . Drug use: No  . Sexual activity: Not on file

## 2019-10-03 ENCOUNTER — Telehealth: Payer: Self-pay | Admitting: Orthopaedic Surgery

## 2019-10-03 ENCOUNTER — Other Ambulatory Visit: Payer: Self-pay | Admitting: Orthopaedic Surgery

## 2019-10-03 DIAGNOSIS — G8929 Other chronic pain: Secondary | ICD-10-CM

## 2019-10-03 DIAGNOSIS — M25512 Pain in left shoulder: Secondary | ICD-10-CM

## 2019-10-03 NOTE — Telephone Encounter (Signed)
Please advise 

## 2019-10-03 NOTE — Telephone Encounter (Signed)
Please call and offer MRI of left shoulder

## 2019-10-03 NOTE — Telephone Encounter (Signed)
Patient called to let Dr. Durward Fortes know that the injection did not work.  CB#[phone].  Thank you

## 2019-10-03 NOTE — Telephone Encounter (Signed)
Spoke with patient. She wants to pursue an MRI. I have placed the order and patient is aware that authorization must take place first, but someone will be in touch for scheduling.

## 2019-10-08 ENCOUNTER — Telehealth: Payer: Self-pay | Admitting: *Deleted

## 2019-10-08 ENCOUNTER — Other Ambulatory Visit: Payer: Self-pay | Admitting: Orthopaedic Surgery

## 2019-10-08 DIAGNOSIS — G8929 Other chronic pain: Secondary | ICD-10-CM

## 2019-10-08 NOTE — Telephone Encounter (Signed)
Pt called stating imaging called to schedule MRI but found out she has Spinal Cord Stimulator so they can do MRI, but can do CT scan instead. Please order CT scan of Shoulder.   Thanks

## 2019-10-08 NOTE — Telephone Encounter (Signed)
Order has been placed.

## 2019-10-25 ENCOUNTER — Ambulatory Visit
Admission: RE | Admit: 2019-10-25 | Discharge: 2019-10-25 | Disposition: A | Discharge status: Home / Self Care | Payer: Medicare Other | Source: Ambulatory Visit | Attending: Orthopaedic Surgery | Admitting: Orthopaedic Surgery

## 2019-10-25 DIAGNOSIS — M25512 Pain in left shoulder: Secondary | ICD-10-CM | POA: Diagnosis not present

## 2019-10-25 DIAGNOSIS — G8929 Other chronic pain: Secondary | ICD-10-CM

## 2019-11-07 ENCOUNTER — Other Ambulatory Visit: Payer: Self-pay

## 2019-11-07 ENCOUNTER — Ambulatory Visit (INDEPENDENT_AMBULATORY_CARE_PROVIDER_SITE_OTHER): Payer: Medicare Other | Admitting: Orthopaedic Surgery

## 2019-11-07 ENCOUNTER — Encounter: Payer: Self-pay | Admitting: Orthopaedic Surgery

## 2019-11-07 VITALS — Ht 67.0 in | Wt 150.0 lb

## 2019-11-07 DIAGNOSIS — G8929 Other chronic pain: Secondary | ICD-10-CM

## 2019-11-07 DIAGNOSIS — M25512 Pain in left shoulder: Secondary | ICD-10-CM | POA: Diagnosis not present

## 2019-11-07 NOTE — Progress Notes (Signed)
Office Visit Note   Patient: [name]           Date of Birth: November 12, 1951           MRN: [id number] Visit Date: 11/07/2019              Requested by: Daylene Posey, [id number] [place],  [place] [address] PCP: Daylene Posey, FNP   Assessment & Plan: Visit Diagnoses:  1. Chronic left shoulder pain     Plan: Left shoulder pain with mild impingement and adhesive capsulitis.  CT scan demonstrated possible biceps pathology with possible rupture.  No evidence of rotator cuff tear.  No joint effusion and no AC joint arthropathy.  Type II acromion.  Had minimal relief with subacromial cortisone injection.  I think her biggest problem is adhesive capsulitis.  Will try a course of physical therapy and if no benefit would consider closed manipulation  Follow-Up Instructions: Return if symptoms worsen or fail to improve.   Orders:  No orders of the defined types were placed in this encounter.  No orders of the defined types were placed in this encounter.     Procedures: No procedures performed   Clinical Data: No additional findings.   Subjective: Chief Complaint  Patient presents with  . Left Shoulder - Follow-up    Left shoulder CT results  Patient presents today for CT results of her left shoulder. She had her CT done on 10/25/2019.  Still having difficulty achieving full overhead motion of the left shoulder.  She does sleep on that side as she has a spinal stimulator on the right.  Having difficulty fastening her bra and doing some of her activities of daily living.  HPI  Review of Systems   Objective: Vital Signs: Ht 5\' 7"  (1.702 m)   Wt 150 lb (68 kg)   BMI 23.49 kg/m   Physical Exam Constitutional:      Appearance: She is well-developed.  Eyes:     Pupils: Pupils are equal, round, and reactive to light.  Pulmonary:     Effort: Pulmonary effort is normal.  Skin:    General: Skin is warm and dry.  Neurological:     Mental Status:  She is alert and oriented to person, place, and time.  Psychiatric:        Behavior: Behavior normal.     Ortho Exam lacks at least 30 degrees of full overhead motion and about 90 degrees of abduction which point she is uncomfortable.  Minimal impingement.  Minimally positive Speed sign but I thought the biceps muscle was intact.  No ecchymosis.  No edema.  Good grip and release.  Good strength of rotator cuff musculature  Specialty Comments:  No specialty comments available.  Imaging: No results found.   PMFS History: Patient Active Problem List   Diagnosis Date Noted  . Pain in left shoulder 09/17/2019  . Synovial cyst of lumbar facet joint 11/21/2013   Past Medical History:  Diagnosis Date  . Arthritis   . Cancer ([place])    left breast  . Chronic kidney disease    cyst right kidney  . Complication of anesthesia   . Head ache    transient global amnesia after migraines 2013  . Headache(784.0)    migraines hx  . PONV (postoperative nausea and vomiting)   . Scoliosis     History reviewed. No pertinent family history.  Past Surgical History:  Procedure Laterality Date  . BACK  SURGERY  2010   microdectomy Dr. Arnoldo Morale  . BREAST SURGERY Left 1999   Dr Irish Elders  . infart fallopian tube  1989    Dr Hoffman/ infarction  . JOINT REPLACEMENT Right 2003    Dr. Durward Fortes rt hip congenital defect  . LUMBAR LAMINECTOMY/DECOMPRESSION MICRODISCECTOMY Bilateral 11/21/2013   Procedure: Bilateral Lumbar three-four laminectomy for synovial cyst;  Surgeon: Ophelia Charter, MD;  Location: [place] [facility];  Service: Neurosurgery;  Laterality: Bilateral;  Bilateral Lumbar three-four laminectomy for synovial cyst  . SPINAL CORD STIMULATOR INSERTION N/A 11/13/2015   Procedure: LUMBAR SPINAL CORD STIMULATOR INSERTION-PERMANENT;  Surgeon: Clydell Hakim, MD;  Location: [place] [facility];  Service: Neurosurgery;  Laterality: N/A;  LUMBAR SPINAL CORD STIMULATOR INSERTION-PERMANENT  . sweat gland Right 1980    excision tumor from sweat gland  . TONSILLECTOMY  1959  . TUBAL LIGATION Bilateral 1988   Dr Heber [place]  . TUBAL LIGATION     Social History   Occupational History  . Not on file  Tobacco Use  . Smoking status: Former Smoker    Packs/day: 0.50    Types: Cigarettes    Quit date: 11/21/1983    Years since quitting: 35.9  . Smokeless tobacco: Never Used  Substance and Sexual Activity  . Alcohol use: Yes    Comment: rarely  . Drug use: No  . Sexual activity: Not on file

## 2019-11-14 DIAGNOSIS — R030 Elevated blood-pressure reading, without diagnosis of hypertension: Secondary | ICD-10-CM | POA: Diagnosis not present

## 2019-11-14 DIAGNOSIS — Z9689 Presence of other specified functional implants: Secondary | ICD-10-CM | POA: Diagnosis not present

## 2019-11-14 DIAGNOSIS — M961 Postlaminectomy syndrome, not elsewhere classified: Secondary | ICD-10-CM | POA: Diagnosis not present

## 2019-11-21 DIAGNOSIS — M7502 Adhesive capsulitis of left shoulder: Secondary | ICD-10-CM | POA: Diagnosis not present

## 2019-11-27 ENCOUNTER — Other Ambulatory Visit: Payer: Self-pay

## 2019-11-27 ENCOUNTER — Ambulatory Visit (INDEPENDENT_AMBULATORY_CARE_PROVIDER_SITE_OTHER): Payer: Medicare Other | Admitting: Orthopaedic Surgery

## 2019-11-27 ENCOUNTER — Encounter: Payer: Self-pay | Admitting: Orthopaedic Surgery

## 2019-11-27 DIAGNOSIS — M7502 Adhesive capsulitis of left shoulder: Secondary | ICD-10-CM

## 2019-11-27 NOTE — Progress Notes (Signed)
Office Visit Note   Patient: [name]           Date of Birth: 1951/03/24           MRN: [id number] Visit Date: 11/27/2019              Requested by: Daylene Posey, [id number] [place],  [place] [address] PCP: Daylene Posey, FNP   Assessment & Plan: Visit Diagnoses:  1. Adhesive capsulitis of left shoulder     Plan: 6 days status post manipulation of left shoulder for adhesive capsulitis and doing quite well.  Has been aggressive with her home exercises.  Has excellent flexion and internal rotation.  I do not think she will need therapy.  Should continue working with her exercises check back with her in 1 month.  Much of the preoperative pain seems to have resolved  Follow-Up Instructions: Return in about 1 month (around 12/28/2019).   Orders:  No orders of the defined types were placed in this encounter.  No orders of the defined types were placed in this encounter.     Procedures: No procedures performed   Clinical Data: No additional findings.   Subjective: Chief Complaint  Patient presents with  . Left Shoulder - Routine Post Op    Left shoulder manipulation DOS 11/21/2019  Patient presents today for her left shoulder. She had left shoulder manipulation on 11/21/2019. She is now 6 days out from surgery. Doing well.   HPI  Review of Systems   Objective: Vital Signs: Ht 5\' 7"  (1.702 m)   Wt 150 lb (68 kg)   BMI 23.49 kg/m   Physical Exam  Ortho Exam left upper extremity neurologically intact.  Able to fully internally rotate left arm across the lower part of her back.  She can easily touch the middle of her thoracolumbar spine.  Has just about full overhead motion lacking maybe 10 degrees.  No ecchymosis.  Negative impingement. Specialty Comments:  No specialty comments available.  Imaging: No results found.   PMFS History: Patient Active Problem List   Diagnosis Date Noted  . Adhesive capsulitis of left shoulder  11/27/2019  . Pain in left shoulder 09/17/2019  . Synovial cyst of lumbar facet joint 11/21/2013   Past Medical History:  Diagnosis Date  . Arthritis   . Cancer ([place])    left breast  . Chronic kidney disease    cyst right kidney  . Complication of anesthesia   . Head ache    transient global amnesia after migraines 2013  . Headache(784.0)    migraines hx  . PONV (postoperative nausea and vomiting)   . Scoliosis     History reviewed. No pertinent family history.  Past Surgical History:  Procedure Laterality Date  . BACK SURGERY  2010   microdectomy Dr. Arnoldo Morale  . BREAST SURGERY Left 1999   Dr Irish Elders  . infart fallopian tube  1989    Dr Hoffman/ infarction  . JOINT REPLACEMENT Right 2003    Dr. Durward Fortes rt hip congenital defect  . LUMBAR LAMINECTOMY/DECOMPRESSION MICRODISCECTOMY Bilateral 11/21/2013   Procedure: Bilateral Lumbar three-four laminectomy for synovial cyst;  Surgeon: Ophelia Charter, MD;  Location: [place] [facility];  Service: Neurosurgery;  Laterality: Bilateral;  Bilateral Lumbar three-four laminectomy for synovial cyst  . SPINAL CORD STIMULATOR INSERTION N/A 11/13/2015   Procedure: LUMBAR SPINAL CORD STIMULATOR INSERTION-PERMANENT;  Surgeon: Clydell Hakim, MD;  Location: [place] [facility];  Service: Neurosurgery;  Laterality: N/A;  LUMBAR SPINAL CORD STIMULATOR INSERTION-PERMANENT  . sweat gland Right 1980   excision tumor from sweat gland  . TONSILLECTOMY  1959  . TUBAL LIGATION Bilateral 1988   Dr Heber [place]  . TUBAL LIGATION     Social History   Occupational History  . Not on file  Tobacco Use  . Smoking status: Former Smoker    Packs/day: 0.50    Types: Cigarettes    Quit date: 11/21/1983    Years since quitting: 36.0  . Smokeless tobacco: Never Used  Substance and Sexual Activity  . Alcohol use: Yes    Comment: rarely  . Drug use: No  . Sexual activity: Not on file

## 2019-12-26 ENCOUNTER — Ambulatory Visit: Payer: Medicare Other | Admitting: Orthopaedic Surgery

## 2020-05-25 IMAGING — CT CT L SPINE W/ CM
1 of 7 series · 6 of 14 positions shown, 8 images · non-contrast
Comparison: CT of the lumbar spine 01/07/2017. MRI of the lumbar
spine 10/10/2014.

CLINICAL DATA: Left-sided low back pain. Pain occasionally extends
into the hips, right greater than left. Patient states she did not
have a left L5 distribution pain prior to use of the spinal cord
stimulator. When the stimulator is active, she does not experience
that pain.
TECHNIQUE: Contiguous axial images were obtained through the Lumbar spine after
the intrathecal infusion of infusion. Coronal and sagittal
reconstructions were obtained of the axial image sets.

[Series 3: l spine soft · axial · 0.34mm/px · z∈[-268,-118]mm · 6 of 72 slices shown, 8 images]
[im 11/72  soft-tissue]
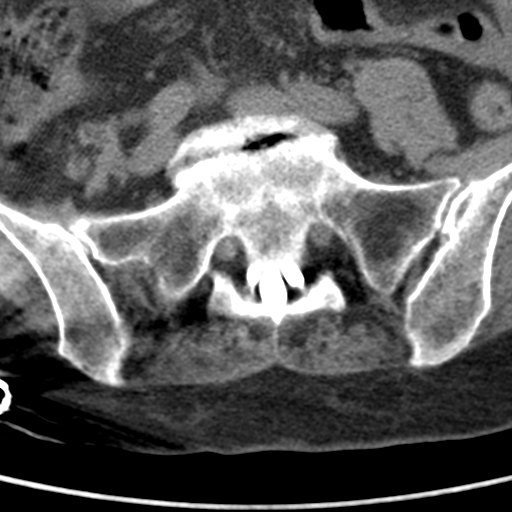
[im 11/72  bone]
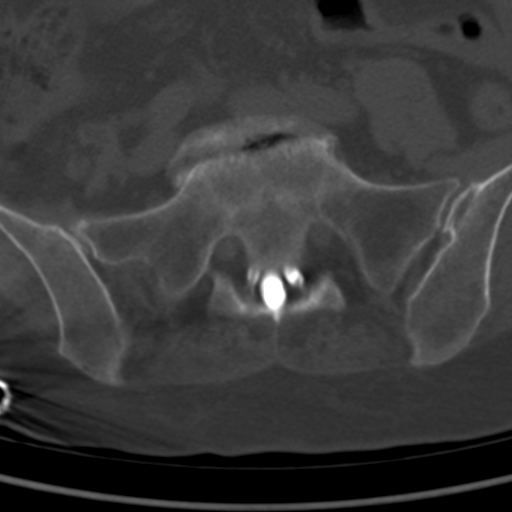
[im 21/72  bone]
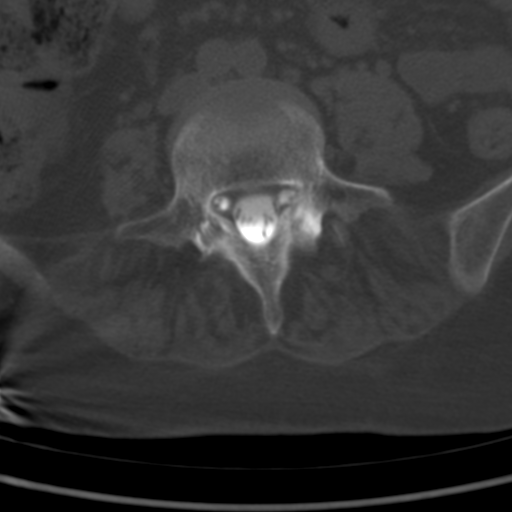
[im 31/72  bone]
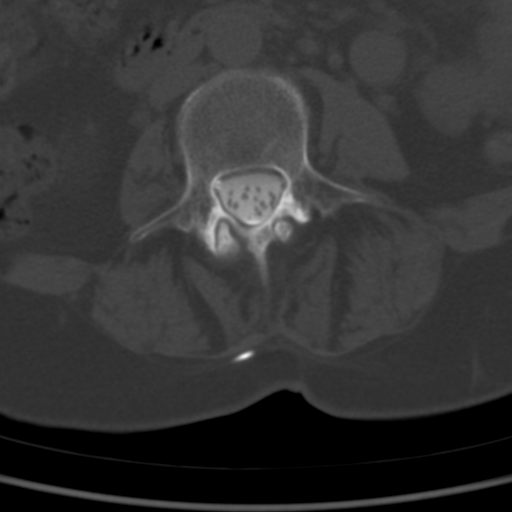
[im 41/72  bone]
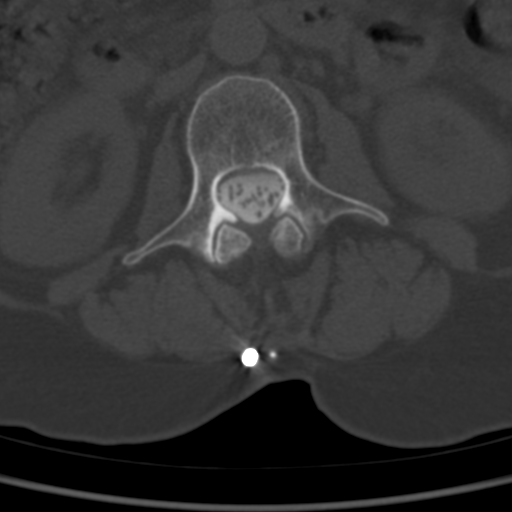
[im 51/72  soft-tissue]
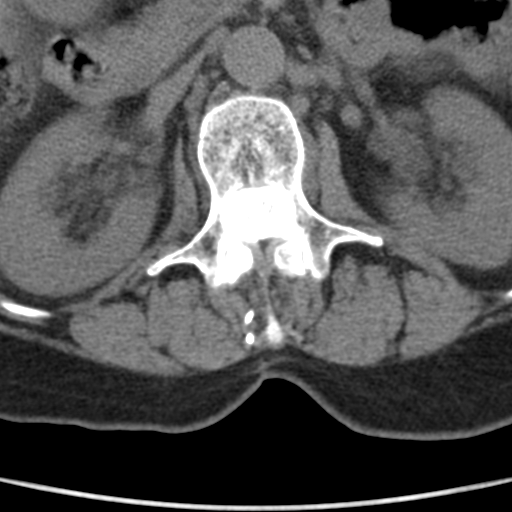
[im 51/72  bone]
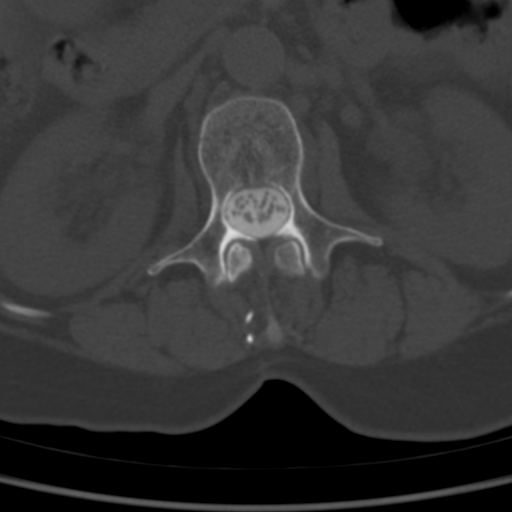
[im 61/72  bone]
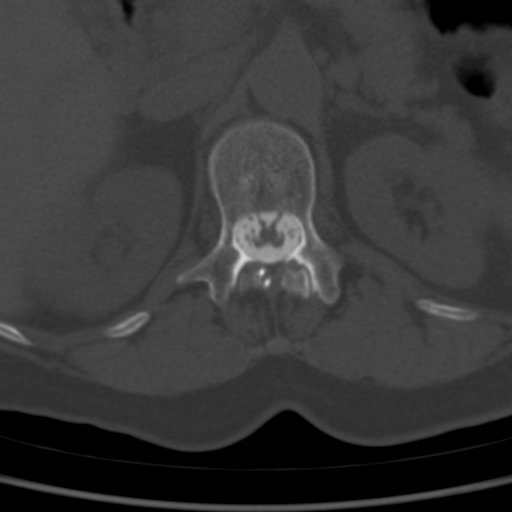

[6 of 14 positions shown; findings below may reference images not displayed]

EXAM:
LUMBAR MYELOGRAM

FLUOROSCOPY TIME:  Radiation Exposure Index (as provided by the
fluoroscopic device): 173.0 uGy*m2

If the device does not provide the exposure index:

Fluoroscopy Time:  33 seconds

Number of Acquired Images:  15

PROCEDURE:
After thorough discussion of risks and benefits of the procedure
including bleeding, infection, injury to nerves, blood vessels,
adjacent structures as well as headache and CSF leak, written and
oral informed consent was obtained. Consent was obtained by Dr.
Thimara Onumaxz. Time out form was completed.

Patient was positioned prone on the fluoroscopy table. Local
anesthesia was provided with 1% lidocaine without epinephrine after
prepped and draped in the usual sterile fashion. Puncture was
performed at L2-3 using a 3 1/2 inch 22-gauge spinal needle via left
paramedian approach. Using a single pass through the dura, the
needle was placed within the thecal sac, with return of clear CSF.
15 mL of Isovue [id number] was injected into the thecal sac, with normal
opacification of the nerve roots and cauda equina consistent with
free flow within the subarachnoid space.

I personally performed the lumbar puncture and administered the
intrathecal contrast. I also personally supervised acquisition of
the myelogram images.
FINDINGS: LUMBAR MYELOGRAM FINDINGS:

Five non rib-bearing lumbar type vertebral bodies are present. L2-3
disc protrusion is again seen. There is chronic loss of disc height
at L5-S1. AP alignment is anatomic. The lower lumbar nerve roots
fill normally on both sides. Mild disc bulging is present at L3-4.
This is slightly more prominent with standing. The L2-3 disc also
increases with standing. There is no change in the disc protrusions
with flexion or extension. Minimal anterolisthesis is present at
L3-4 with flexion, likely secondary to the bilateral pars defects.

CT LUMBAR MYELOGRAM FINDINGS:

The lumbar spine is imaged from the midbody of T12 through S2-3.
Bilateral L3 pars defects are again seen. There is neutral alignment
in the supine position. Mild rightward curvature of the lumbar spine
is centered at L3.

The conus medullaris terminates at L1. Spinal cord stimulator wires
enter the spinal canal at T12-L1.

Atherosclerotic calcifications are present in the aorta. There is no
aneurysm.

L1-2: Negative.

L2-3: A leftward disc protrusion is present. Mild left subarticular
narrowing is present. Foramina are patent bilaterally.

L3-4: Bilateral pars defects are present. Wide laminectomy is noted.
Disc extends into the foramina bilaterally with mild bilateral
foraminal narrowing.

L4-5: Leftward disc bulging is present. Mild facet hypertrophy is
noted. No significant stenosis is present.

L5-S1: Central canal is patent. Right greater left facet spurring
results an moderate right and mild left foraminal stenosis.
IMPRESSION: 1. Bilateral L3 pars defects slight and dynamic anterolisthesis
during flexion.
2. Broad-based L2-3 disc protrusion is exaggerated with standing. In
the supine position mild left subarticular narrowing is present.
3. L3 laminectomy with residual or recurrent bilateral foraminal
stenosis at L3-4.
4. Leftward disc bulging at L4-5 without significant stenosis.
5. Asymmetric right-sided facet spurring at L5-S1 results in
moderate right and mild left foraminal stenosis.

## 2021-08-31 IMAGING — CT CT SHOULDER*L* W/O CM
2 series · 15 of 20 positions shown, 18 images · non-contrast
Comparison: Radiographs dated 09/17/2019

CLINICAL DATA: Left shoulder pain.

EXAM:
CT OF THE UPPER LEFT EXTREMITY WITHOUT CONTRAST
TECHNIQUE: Multidetector CT imaging of the upper left extremity was performed
according to the standard protocol.

[Series 4: shoulder 2.00 br40 s3 axial soft · axial · 0.39mm/px · z∈[-1085,-915]mm · 12 of 101 slices shown, 15 images]
[im 8/101  soft-tissue]
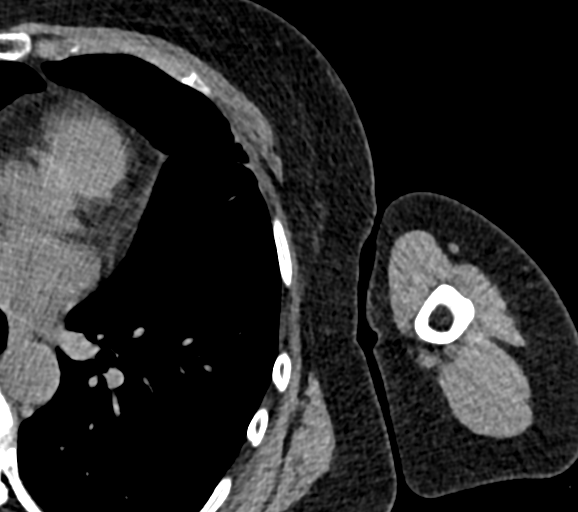
[im 8/101  bone]
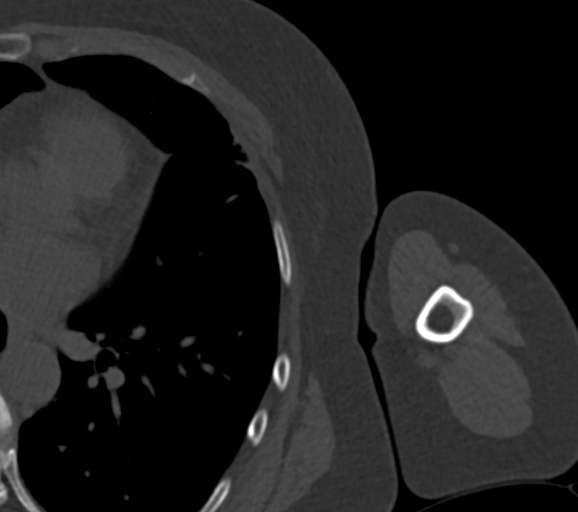
[im 16/101  bone]
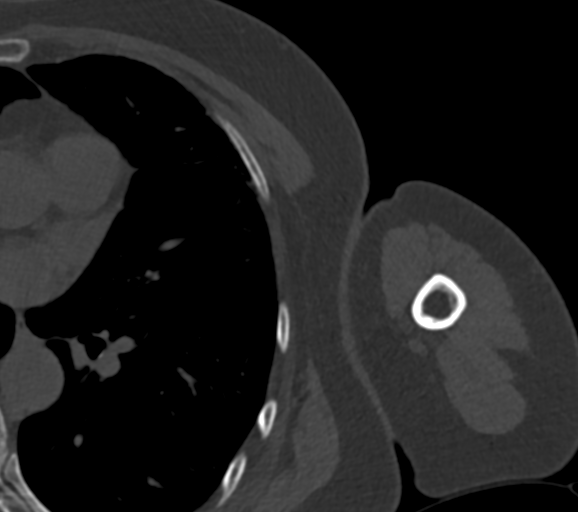
[im 24/101  bone]
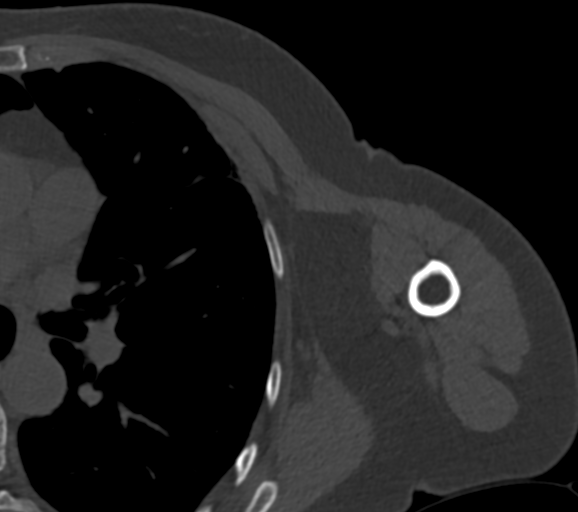
[im 31/101  bone]
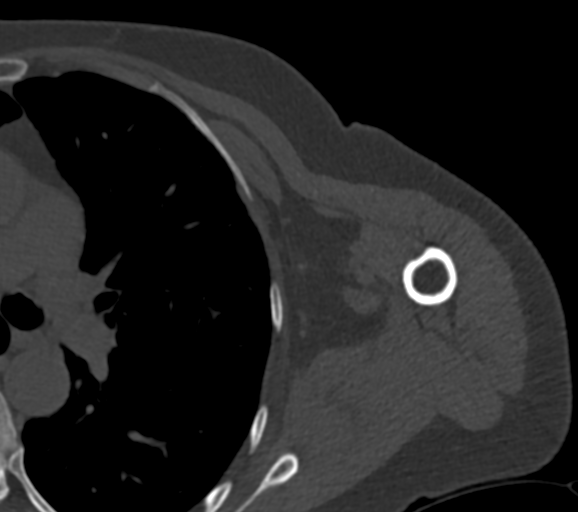
[im 39/101  soft-tissue]
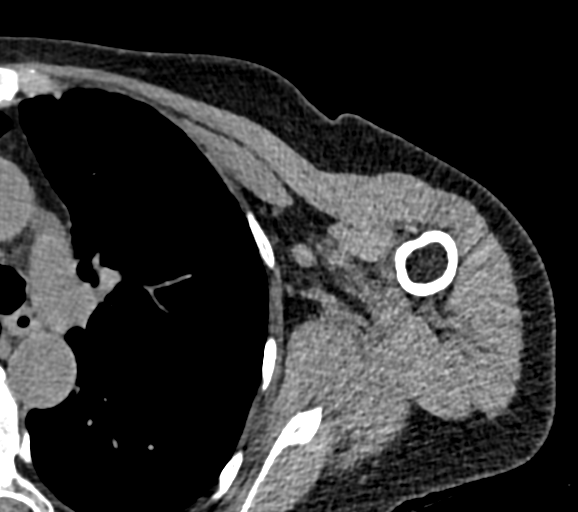
[im 39/101  bone]
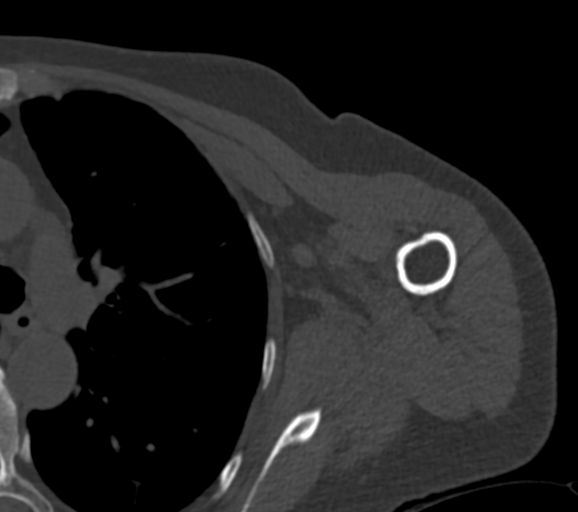
[im 47/101  bone]
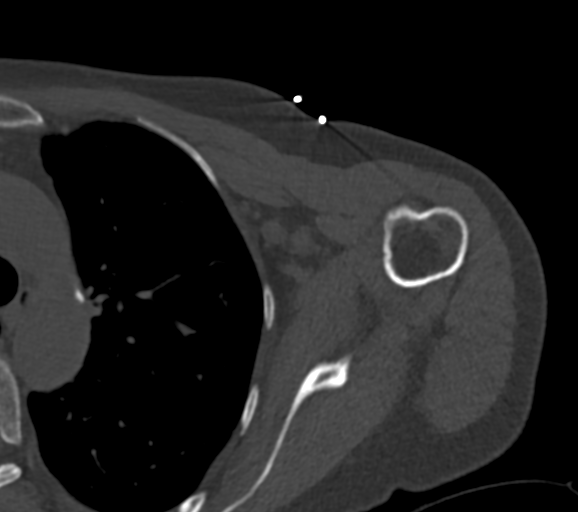
[im 54/101  bone]
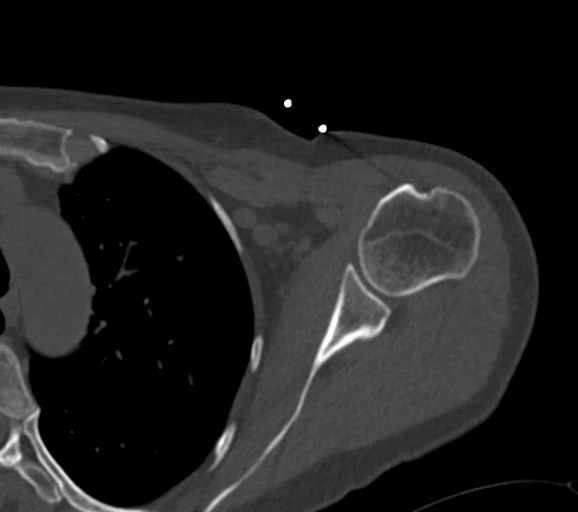
[im 62/101  bone]
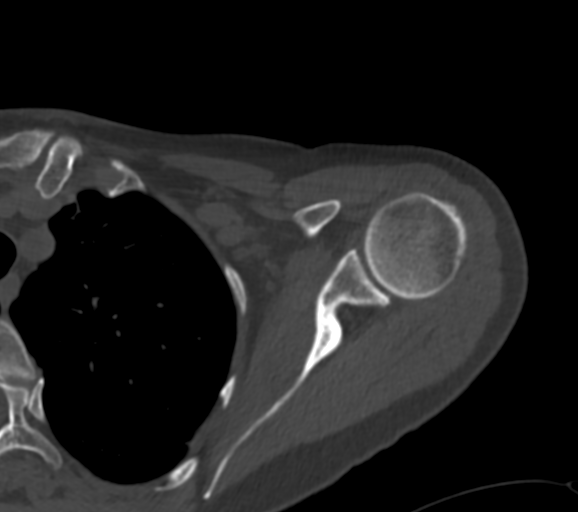
[im 70/101  soft-tissue]
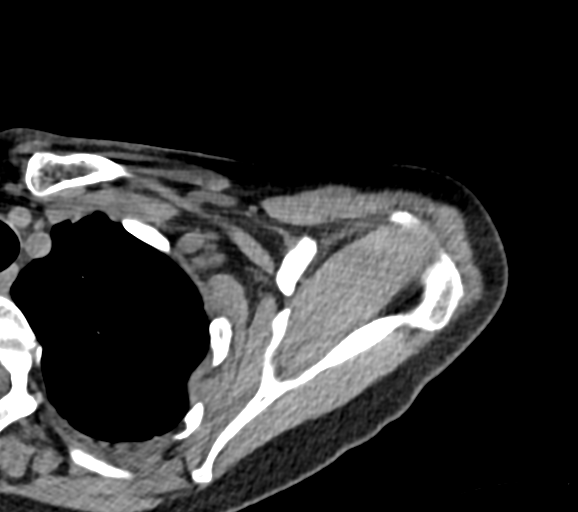
[im 70/101  bone]
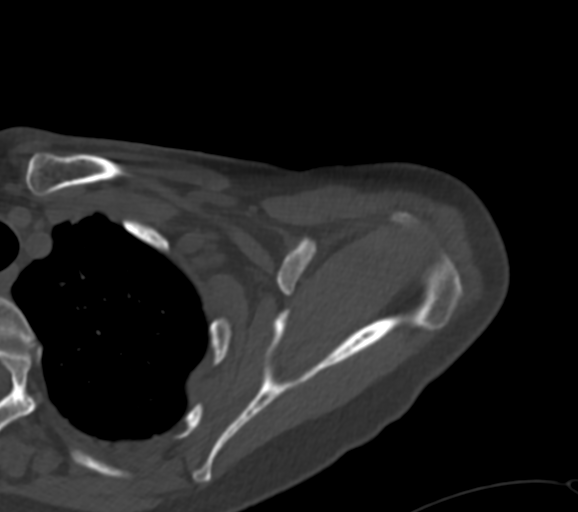
[im 77/101  bone]
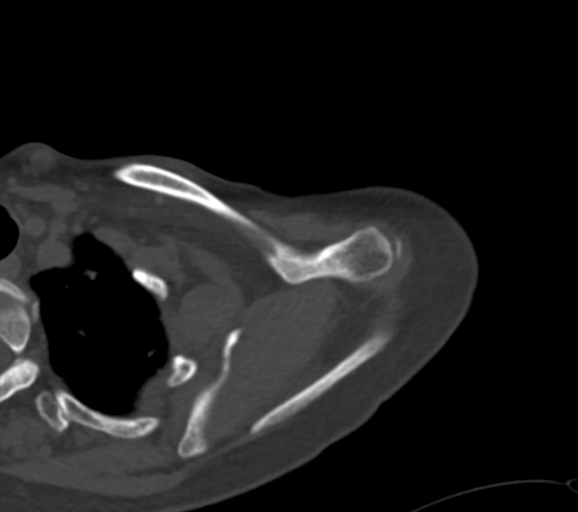
[im 85/101  bone]
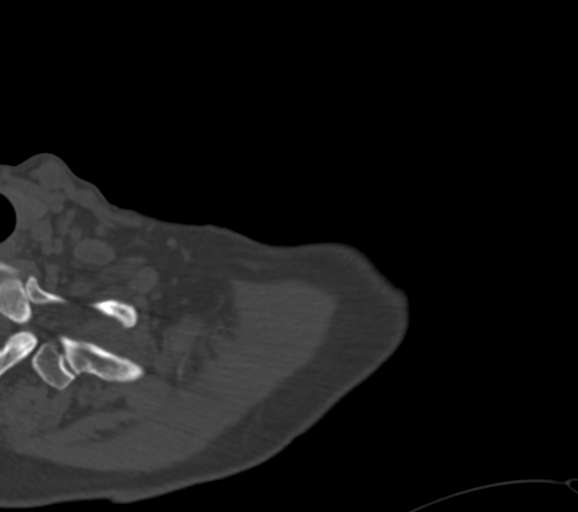
[im 93/101  bone]
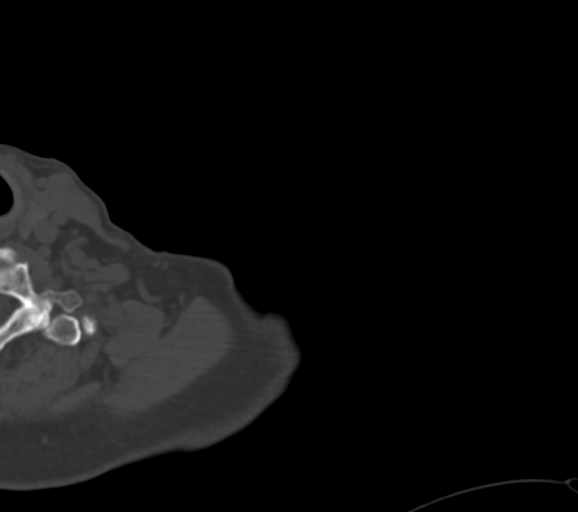

[Series 8: shoulder 2.00 br40 s3 cor soft · coronal · 0.40mm/px · 3 of 79 slices shown]
[im 16/79  bone]
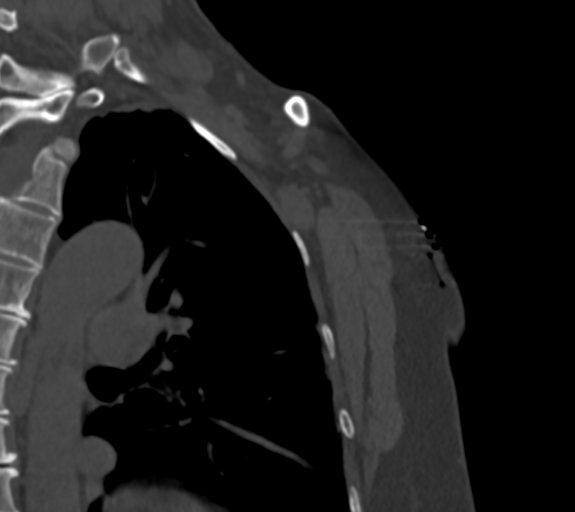
[im 32/79  bone]
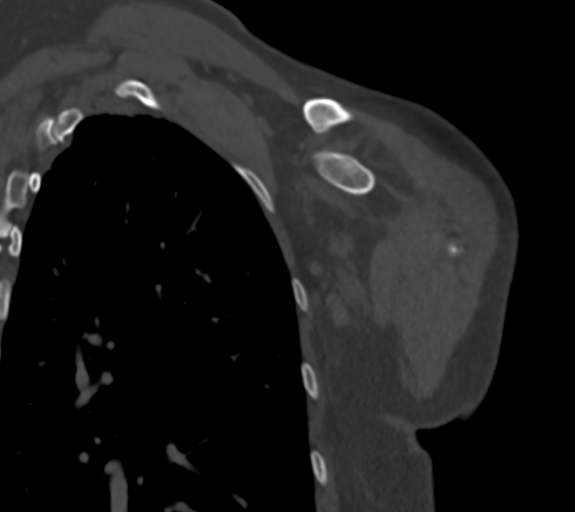
[im 47/79  bone]
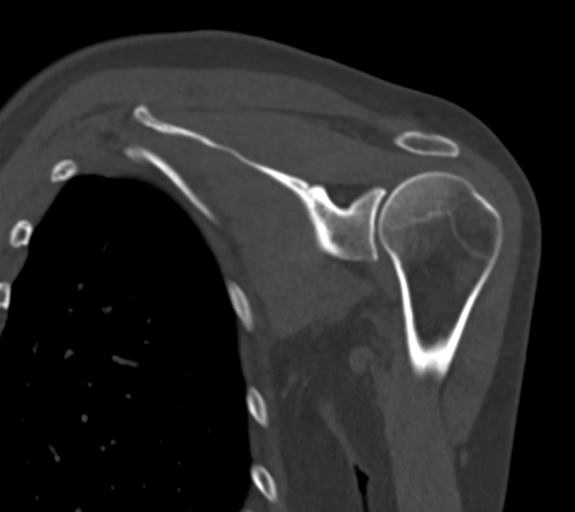

[15 of 20 positions shown; findings below may reference images not displayed]

FINDINGS: Bones/Joint/Cartilage

The bones of the left shoulder appear normal including the AC joint
and glenohumeral joint. No AC joint arthropathy. Normal type 2
acromion. No joint effusions.

Muscles and Tendons

There is no atrophy of the muscles around the shoulder. The long
head of the biceps tendon is not identified in the bicipital groove.
Does the patient have any clinical findings of rupture of the long
head of the biceps tendon?

Soft tissues

Normal.
IMPRESSION: 1. The bones, joints and muscles of the left shoulder are normal.
2. The long head of the biceps tendon is not identified in the
bicipital groove. Does the patient have any clinical findings of
rupture of the long head of the biceps tendon?
